# Patient Record
Sex: Female | Born: 1985 | Race: White | Hispanic: No | Marital: Married | Smoking: Former smoker
Health system: Southern US, Community
[De-identification: ages and names within clinical notes are randomized; demographics above are authoritative.]

## PROBLEM LIST (undated history)

## (undated) DIAGNOSIS — F419 Anxiety disorder, unspecified: Secondary | ICD-10-CM

## (undated) HISTORY — PX: APPENDECTOMY: SHX54

---
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
## 2009-02-12 ENCOUNTER — Emergency Department (HOSPITAL_COMMUNITY): Admission: EM | Admit: 2009-02-12 | Discharge: 2009-02-12 | Payer: Self-pay | Admitting: Emergency Medicine

## 2009-12-23 ENCOUNTER — Emergency Department (HOSPITAL_COMMUNITY): Admission: EM | Admit: 2009-12-23 | Discharge: 2009-12-23 | Payer: Self-pay | Admitting: Family Medicine

## 2011-04-21 ENCOUNTER — Emergency Department (HOSPITAL_COMMUNITY)
Admission: EM | Admit: 2011-04-21 | Discharge: 2011-04-21 | Disposition: A | Discharge status: Home Health | Payer: Self-pay | Source: Home / Self Care | Attending: Emergency Medicine | Admitting: Emergency Medicine

## 2011-04-21 ENCOUNTER — Encounter (HOSPITAL_COMMUNITY): Payer: Self-pay | Admitting: Emergency Medicine

## 2011-04-21 DIAGNOSIS — N3000 Acute cystitis without hematuria: Secondary | ICD-10-CM

## 2011-04-21 LAB — POCT URINALYSIS DIP (DEVICE)
Glucose, UA: NEGATIVE mg/dL
Nitrite: NEGATIVE
Specific Gravity, Urine: 1.01 (ref 1.005–1.030)
Urobilinogen, UA: 0.2 mg/dL (ref 0.0–1.0)
pH: 6.5 (ref 5.0–8.0)

## 2011-04-21 MED ORDER — PHENAZOPYRIDINE HCL 200 MG PO TABS: 200.0000 mg | ORAL_TABLET | Freq: Three times a day (TID) | ORAL | Status: AC | PRN

## 2011-04-21 MED ORDER — CEPHALEXIN 500 MG PO CAPS: 500.0000 mg | ORAL_CAPSULE | Freq: Three times a day (TID) | ORAL | Status: AC

## 2011-04-21 NOTE — ED Notes (Signed)
HERE WITH UTI SX THAT STARTED YESTERDAY.FREQ/URGE AND PAIN POST VOID WITH ACHES,CHILLS AND RADIATING PAIN TO LOWER BACK.NO HEMATURIA OR FEVERS BUT NOTICED CHILLS,BODY ACHES.PT TRIED IBUPROFEN FOR PAIN.LMP 04/01/11

## 2011-04-21 NOTE — ED Provider Notes (Signed)
Chief Complaint  Patient presents with  . Urinary Tract Infection    History of Present Illness:   Melissa Roberson is a 26 year old female who has had a two-day history of bladder spasms, dysuria, burning with urination, frequency, and urgency. She has felt somewhat achy all over and been nauseated. She's had chills and sweats but no definite fever. She's had some headache and suprapubic pain. She has had urinary tract infections before, but her last was 4 years ago. When she was a teenager she took antibiotics for 6 months for suppression. She denies any GYN complaints.  Review of Systems:  Other than noted above, the patient denies any of the following symptoms: General:  No fevers, chills, sweats, aches, or fatigue. GI:  No abdominal pain, back pain, nausea, vomiting, diarrhea, or constipation. GU:  No dysuria, frequency, urgency, hematuria, or incontinence. GYN:  No discharge, itching, vulvar pain or lesions, pelvic pain, or abnormal vaginal bleeding.  PMFSH:  Past medical history, family history, social history, meds, and allergies were reviewed.  Physical Exam:   Vital signs:  BP 110/63  Pulse 84  Temp(Src) 98.2 F (36.8 C) (Oral)  Resp 16  SpO2 100%  LMP 04/01/2011 Gen:  Alert, oriented, in no distress. Lungs:  Clear to auscultation, no wheezes, rales or rhonchi. Heart:  Regular rhythm, no gallop or murmer. Abdomen:  Flat and soft. There was slight suprapubic pain to palpation.  No guarding, or rebound.  No hepato-splenomegaly or mass.  Bowel sounds were normally active.  No hernia. Back:  No CVA tenderness.  Skin:  Clear, warm and dry.  Labs:   Results for orders placed during the hospital encounter of 04/21/11  POCT URINALYSIS DIP (DEVICE)      Component Value Range   Glucose, UA NEGATIVE  NEGATIVE (mg/dL)   Bilirubin Urine NEGATIVE  NEGATIVE    Ketones, ur NEGATIVE  NEGATIVE (mg/dL)   Specific Gravity, Urine 1.010  1.005 - 1.030    Hgb urine dipstick LARGE (*) NEGATIVE    pH  6.5  5.0 - 8.0    Protein, ur 100 (*) NEGATIVE (mg/dL)   Urobilinogen, UA 0.2  0.0 - 1.0 (mg/dL)   Nitrite NEGATIVE  NEGATIVE    Leukocytes, UA MODERATE (*) NEGATIVE   POCT PREGNANCY, URINE      Component Value Range   Preg Test, Ur NEGATIVE  NEGATIVE     A urine culture was not obtained at patient's request. She is concerned about incurring a large bill.  Assessment:  Diagnoses that have been ruled out:  None  Diagnoses that are still under consideration:  None  Final diagnoses:  Acute cystitis     Plan:   1.  The following meds were prescribed:   New Prescriptions   CEPHALEXIN (KEFLEX) 500 MG CAPSULE    Take 1 capsule (500 mg total) by mouth 3 (three) times daily.   PHENAZOPYRIDINE (PYRIDIUM) 200 MG TABLET    Take 1 tablet (200 mg total) by mouth 3 (three) times daily as needed for pain.   2.  The patient was instructed in symptomatic care and handouts were given. 3.  The patient was told to return if becoming worse in any way, if no better in 3 or 4 days, and given some red flag symptoms that would indicate earlier return. 4.  The patient was told to avoid intercourse for 10 days, get extra fluids, and return for a follow up with her primary care doctor at the completion of treatment for  a repeat UA and culture.     Roque Lias, MD 04/21/11 [phone]

## 2011-04-21 NOTE — Discharge Instructions (Signed)

## 2011-06-25 ENCOUNTER — Emergency Department (HOSPITAL_COMMUNITY)
Admission: EM | Admit: 2011-06-25 | Discharge: 2011-06-25 | Disposition: A | Discharge status: Home / Self Care | Payer: Self-pay | Source: Home / Self Care | Attending: Family Medicine | Admitting: Family Medicine

## 2011-06-25 ENCOUNTER — Encounter (HOSPITAL_COMMUNITY): Payer: Self-pay | Admitting: *Deleted

## 2011-06-25 DIAGNOSIS — N390 Urinary tract infection, site not specified: Secondary | ICD-10-CM

## 2011-06-25 LAB — POCT URINALYSIS DIP (DEVICE)
Bilirubin Urine: NEGATIVE
Glucose, UA: NEGATIVE mg/dL
Nitrite: POSITIVE — AB
Specific Gravity, Urine: 1.01 (ref 1.005–1.030)
pH: 5.5 (ref 5.0–8.0)

## 2011-06-25 MED ORDER — CEPHALEXIN 500 MG PO CAPS: 500.0000 mg | ORAL_CAPSULE | Freq: Four times a day (QID) | ORAL | Status: AC

## 2011-06-25 NOTE — ED Provider Notes (Signed)
History     CSN: [id number]  Arrival date & time 06/25/11  1409   First MD Initiated Contact with Patient 06/25/11 1437      Chief Complaint  Patient presents with  . Urinary Frequency    (Consider location/radiation/quality/duration/timing/severity/associated sxs/prior treatment) Patient is a 26 y.o. female presenting with frequency. The history is provided by the patient.  Urinary Frequency This is a new problem. The current episode started 2 days ago (first time in sev months, h/o chronic suppression.). The problem occurs constantly. The problem has been gradually worsening. Pertinent negatives include no abdominal pain.    History reviewed. No pertinent past medical history.  Past Surgical History  Procedure Date  . Cesarean section     History reviewed. No pertinent family history.  History  Substance Use Topics  . Smoking status: Current Everyday Smoker  . Smokeless tobacco: Not on file  . Alcohol Use: Yes    OB History    Grav Para Term Preterm Abortions TAB SAB Ect Mult Living                  Review of Systems  Constitutional: Negative.   Gastrointestinal: Negative.  Negative for abdominal pain.  Genitourinary: Positive for dysuria, urgency, frequency and hematuria. Negative for vaginal bleeding, vaginal discharge and vaginal pain.    Allergies  Review of patient's allergies indicates no known allergies.  Home Medications   Current Outpatient Rx  Name Route Sig Dispense Refill  . CEPHALEXIN 500 MG PO CAPS Oral Take 1 capsule (500 mg total) by mouth 4 (four) times daily. Take all of medicine and drink lots of fluids 20 capsule 0  . MULTIVITAMINS PO CAPS Oral Take 1 capsule by mouth daily.      BP 130/77  Pulse 85  Temp(Src) 98.5 F (36.9 C) (Oral)  Resp 17  SpO2 97%  Physical Exam  Nursing note and vitals reviewed. Constitutional: She is oriented to person, place, and time. She appears well-developed and well-nourished.  Abdominal: Soft.  Bowel sounds are normal. There is no tenderness. There is no CVA tenderness.  Neurological: She is alert and oriented to person, place, and time.  Skin: Skin is warm and dry.    ED Course  Procedures (including critical care time)  Labs Reviewed  POCT URINALYSIS DIP (DEVICE) - Abnormal; Notable for the following:    Hgb urine dipstick LARGE (*)    Protein, ur 30 (*)    Nitrite POSITIVE (*)    Leukocytes, UA LARGE (*) Biochemical Testing Only. Please order routine urinalysis from main lab if confirmatory testing is needed.   All other components within normal limits  POCT PREGNANCY, URINE   No results found.   1. UTI (lower urinary tract infection)       MDM  U/a abnl.        Linna Hoff, MD 06/25/11 [phone]

## 2011-06-25 NOTE — ED Notes (Signed)
Co low abd pain, with frequent urination, burning with urination x 2 days, blood in urine this am.

## 2012-04-19 ENCOUNTER — Encounter (HOSPITAL_COMMUNITY): Payer: Self-pay | Admitting: *Deleted

## 2012-04-19 ENCOUNTER — Emergency Department (HOSPITAL_COMMUNITY)
Admission: EM | Admit: 2012-04-19 | Discharge: 2012-04-19 | Disposition: A | Discharge status: Home / Self Care | Payer: Self-pay | Source: Home / Self Care

## 2012-04-19 MED ORDER — AMOXICILLIN 500 MG PO CAPS: 500.0000 mg | ORAL_CAPSULE | Freq: Two times a day (BID) | ORAL | Status: DC

## 2012-04-19 NOTE — ED Provider Notes (Signed)
History     CSN: [id number]  Arrival date & time 04/19/12  1026   None     Chief Complaint  Patient presents with  . Generalized Body Aches    (Consider location/radiation/quality/duration/timing/severity/associated sxs/prior treatment) HPI Comments: Pt sick with cold sx for a week, feels is worsening instead of getting better.  Began taking rx for zithromax she got from sister 2 days ago.  Doesn't feel is helping. Drainage from coughing and from nose same.   Patient is a 27 y.o. female presenting with URI. The history is provided by the patient.  URI Presenting symptoms: congestion, cough, facial pain, fatigue, fever, rhinorrhea and sore throat   Presenting symptoms: no ear pain   Severity:  Severe Onset quality:  Gradual Duration:  7 days Timing:  Constant Progression:  Worsening Chronicity:  New Relieved by:  Nothing Ineffective treatments:  OTC medications and rest Associated symptoms: headaches, myalgias and sinus pain     History reviewed. No pertinent past medical history.  Past Surgical History  Procedure Laterality Date  . Cesarean section      History reviewed. No pertinent family history.  History  Substance Use Topics  . Smoking status: Light Tobacco Smoker    Types: Cigarettes  . Smokeless tobacco: Never Used     Comment: Only smokes when drinks ETOH  . Alcohol Use: Yes     Comment: Socially    OB History   Grav Para Term Preterm Abortions TAB SAB Ect Mult Living                  Review of Systems  Constitutional: Positive for fever, chills and fatigue.  HENT: Positive for congestion, sore throat, rhinorrhea, postnasal drip and sinus pressure. Negative for ear pain.   Respiratory: Positive for cough.   Musculoskeletal: Positive for myalgias.  Neurological: Positive for headaches.    Allergies  Review of patient's allergies indicates not on file.  Home Medications   Current Outpatient Rx  Name  Route  Sig  Dispense  Refill  .  acetaminophen (TYLENOL) 500 MG tablet   Oral   Take 500 mg by mouth every 6 (six) hours as needed for pain.         Marland Kitchen azithromycin (ZITHROMAX) 1 G powder   Oral   Take 1 packet by mouth once.         Marland Kitchen ibuprofen (ADVIL,MOTRIN) 200 MG tablet   Oral   Take 800 mg by mouth every 6 (six) hours as needed for pain.         Marland Kitchen amoxicillin (AMOXIL) 500 MG capsule   Oral   Take 1 capsule (500 mg total) by mouth 2 (two) times daily.   20 capsule   0   . Multiple Vitamin (MULTIVITAMIN) capsule   Oral   Take 1 capsule by mouth daily.           BP 128/81  Pulse 100  Temp(Src) 99.4 F (37.4 C) (Oral)  SpO2 100%  LMP 03/25/2012  Physical Exam  Constitutional: She appears well-developed and well-nourished. She appears ill. No distress.  HENT:  Right Ear: Tympanic membrane, external ear and ear canal normal.  Left Ear: Tympanic membrane, external ear and ear canal normal.  Nose: Mucosal edema and rhinorrhea present. Right sinus exhibits maxillary sinus tenderness. Right sinus exhibits no frontal sinus tenderness. Left sinus exhibits maxillary sinus tenderness. Left sinus exhibits no frontal sinus tenderness.  Mouth/Throat: Oropharynx is clear and moist.  Cardiovascular: Normal rate  and regular rhythm.   Pulmonary/Chest: Effort normal and breath sounds normal. She exhibits no tenderness.  Lymphadenopathy:       Head (right side): No submental, no submandibular and no tonsillar adenopathy present.       Head (left side): No submental, no submandibular and no tonsillar adenopathy present.    ED Course  Procedures (including critical care time)  Labs Reviewed - No data to display No results found.   1. Sinusitis       MDM  This is likely viral infection but could be sinusitis.  Pt already taking zpack.  Discussed need for sudafed and saline nasal spray. Explained most sinus infections clear themselves.  Rx amox but pt not to take unless still sick on day 10-11.  Pt agrees.          Cathlyn Parsons, NP 04/19/12 [phone]

## 2012-04-19 NOTE — ED Provider Notes (Signed)
Medical screening examination/treatment/procedure(s) were performed by resident physician or non-physician practitioner and as supervising physician I was immediately available for consultation/collaboration.   Roland Prine DOUGLAS MD.   Javonni Macke D Adrienne Trombetta, MD 04/19/12 1458 

## 2012-04-19 NOTE — ED Notes (Signed)
Sore throat - productive cough green phlegm,with "chunks of blood" ( "not that bad now") - diarrhea - severe body aches.onset 8 days ago.

## 2013-06-29 ENCOUNTER — Emergency Department (HOSPITAL_COMMUNITY): Payer: BC Managed Care – PPO

## 2013-06-29 ENCOUNTER — Encounter (HOSPITAL_COMMUNITY): Payer: Self-pay | Admitting: Emergency Medicine

## 2013-06-29 ENCOUNTER — Observation Stay (HOSPITAL_COMMUNITY)
Admission: EM | Admit: 2013-06-29 | Discharge: 2013-06-30 | Disposition: A | Discharge status: Home / Self Care | Payer: BC Managed Care – PPO | Attending: General Surgery | Admitting: General Surgery

## 2013-06-29 ENCOUNTER — Encounter (HOSPITAL_COMMUNITY): Payer: BC Managed Care – PPO | Admitting: Anesthesiology

## 2013-06-29 ENCOUNTER — Emergency Department (HOSPITAL_COMMUNITY): Payer: BC Managed Care – PPO | Admitting: Anesthesiology

## 2013-06-29 ENCOUNTER — Encounter (HOSPITAL_COMMUNITY)
Admission: EM | Disposition: A | Discharge status: Home / Self Care | Payer: Self-pay | Source: Home / Self Care | Attending: Emergency Medicine

## 2013-06-29 DIAGNOSIS — D3A020 Benign carcinoid tumor of the appendix: Principal | ICD-10-CM | POA: Insufficient documentation

## 2013-06-29 DIAGNOSIS — K3580 Unspecified acute appendicitis: Secondary | ICD-10-CM

## 2013-06-29 DIAGNOSIS — F17200 Nicotine dependence, unspecified, uncomplicated: Secondary | ICD-10-CM | POA: Insufficient documentation

## 2013-06-29 DIAGNOSIS — K37 Unspecified appendicitis: Secondary | ICD-10-CM | POA: Diagnosis present

## 2013-06-29 DIAGNOSIS — R109 Unspecified abdominal pain: Secondary | ICD-10-CM

## 2013-06-29 HISTORY — PX: LAPAROSCOPIC APPENDECTOMY: SHX408

## 2013-06-29 HISTORY — DX: Anxiety disorder, unspecified: F41.9

## 2013-06-29 LAB — CBC WITH DIFFERENTIAL/PLATELET
BASOS ABS: 0 10*3/uL (ref 0.0–0.1)
Basophils Relative: 0 % (ref 0–1)
Eosinophils Absolute: 0 10*3/uL (ref 0.0–0.7)
Eosinophils Relative: 0 % (ref 0–5)
HEMATOCRIT: 39.7 % (ref 36.0–46.0)
HEMOGLOBIN: 13.6 g/dL (ref 12.0–15.0)
LYMPHS PCT: 11 % — AB (ref 12–46)
Lymphs Abs: 1.2 10*3/uL (ref 0.7–4.0)
MCH: 31 pg (ref 26.0–34.0)
MCHC: 34.3 g/dL (ref 30.0–36.0)
MCV: 90.4 fL (ref 78.0–100.0)
MONO ABS: 0.4 10*3/uL (ref 0.1–1.0)
MONOS PCT: 4 % (ref 3–12)
NEUTROS ABS: 8.9 10*3/uL — AB (ref 1.7–7.7)
NEUTROS PCT: 84 % — AB (ref 43–77)
Platelets: 217 10*3/uL (ref 150–400)
RBC: 4.39 MIL/uL (ref 3.87–5.11)
RDW: 12.1 % (ref 11.5–15.5)
WBC: 10.5 10*3/uL (ref 4.0–10.5)

## 2013-06-29 LAB — I-STAT CHEM 8, ED
BUN: 10 mg/dL (ref 6–23)
CHLORIDE: 109 meq/L (ref 96–112)
CREATININE: 0.7 mg/dL (ref 0.50–1.10)
Calcium, Ion: 0.93 mmol/L — ABNORMAL LOW (ref 1.12–1.23)
GLUCOSE: 99 mg/dL (ref 70–99)
HEMATOCRIT: 41 % (ref 36.0–46.0)
Hemoglobin: 13.9 g/dL (ref 12.0–15.0)
POTASSIUM: 6 meq/L — AB (ref 3.7–5.3)
SODIUM: 137 meq/L (ref 137–147)
TCO2: 20 mmol/L (ref 0–100)

## 2013-06-29 LAB — COMPREHENSIVE METABOLIC PANEL
ALT: 14 U/L (ref 0–35)
AST: 15 U/L (ref 0–37)
Albumin: 4.2 g/dL (ref 3.5–5.2)
Alkaline Phosphatase: 51 U/L (ref 39–117)
BUN: 9 mg/dL (ref 6–23)
CALCIUM: 9 mg/dL (ref 8.4–10.5)
CO2: 24 mEq/L (ref 19–32)
CREATININE: 0.62 mg/dL (ref 0.50–1.10)
Chloride: 105 mEq/L (ref 96–112)
GFR calc non Af Amer: >90 mL/min (ref >90)
GLUCOSE: 100 mg/dL — AB (ref 70–99)
Potassium: 4.4 mEq/L (ref 3.7–5.3)
Sodium: 142 mEq/L (ref 137–147)
TOTAL PROTEIN: 7.3 g/dL (ref 6.0–8.3)
Total Bilirubin: 0.5 mg/dL (ref 0.3–1.2)

## 2013-06-29 LAB — LIPASE, BLOOD: Lipase: 26 U/L (ref 11–59)

## 2013-06-29 LAB — PREGNANCY, URINE: Preg Test, Ur: NEGATIVE

## 2013-06-29 LAB — URINALYSIS, ROUTINE W REFLEX MICROSCOPIC
BILIRUBIN URINE: NEGATIVE
Glucose, UA: NEGATIVE mg/dL
Hgb urine dipstick: NEGATIVE
Leukocytes, UA: NEGATIVE
NITRITE: NEGATIVE
PROTEIN: NEGATIVE mg/dL
SPECIFIC GRAVITY, URINE: 1.017 (ref 1.005–1.030)
UROBILINOGEN UA: 0.2 mg/dL (ref 0.0–1.0)
pH: 7.5 (ref 5.0–8.0)

## 2013-06-29 IMAGING — CT CT ABD-PELV W/ CM
1 of 2 series · 15 of 32 positions shown, 19 images · IV contrast (OMNIPAQUE 300)
Comparison: None.

CLINICAL DATA: Abdominal pain with nausea and vomiting.

EXAM:
CT ABDOMEN AND PELVIS WITH CONTRAST
TECHNIQUE: Multidetector CT imaging of the abdomen and pelvis was performed
using the standard protocol following bolus administration of
intravenous contrast.
CONTRAST:  50mL OMNIPAQUE IOHEXOL 300 MG/ML SOLN, 100mL OMNIPAQUE
IOHEXOL 300 MG/ML SOLN

[Series 2: abd/pel with · axial · 0.74mm/px · z∈[-494,-99]mm · 15 of 87 slices shown, 19 images]
[im 4/87  soft-tissue]
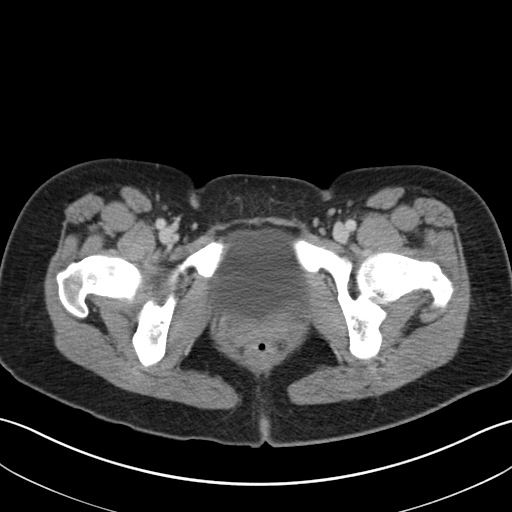
[im 4/87  bone]
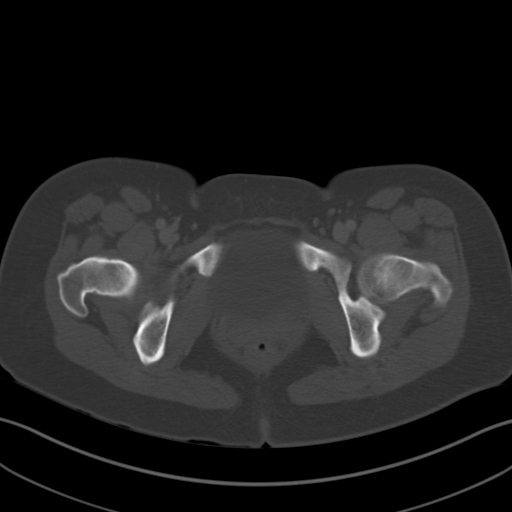
[im 11/87  soft-tissue]
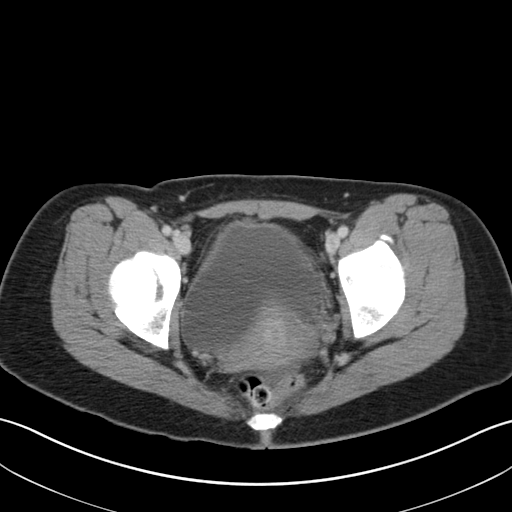
[im 18/87  soft-tissue]
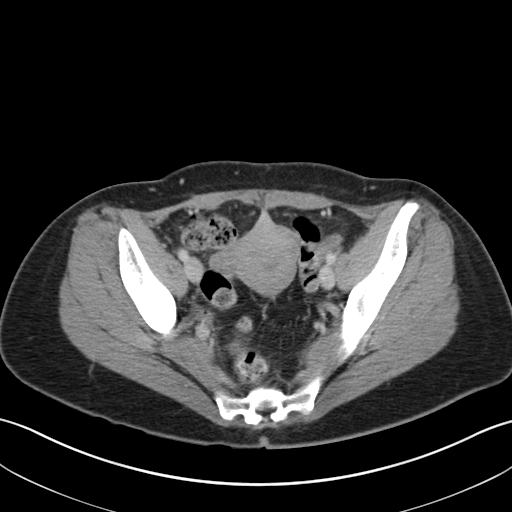
[im 26/87  soft-tissue]
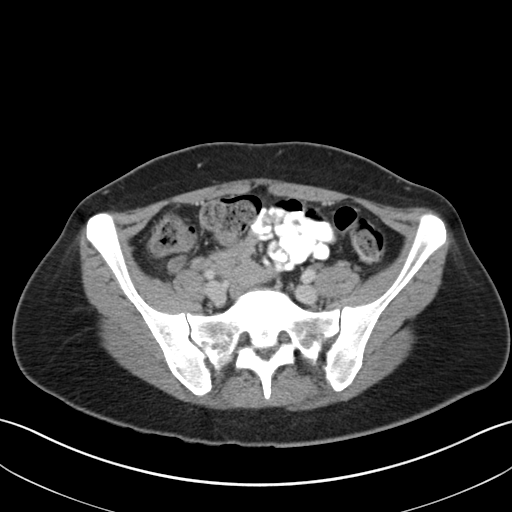
[im 29/87  soft-tissue]
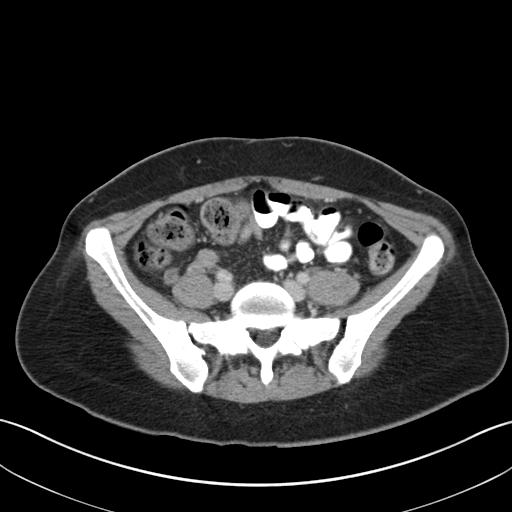
[im 36/87  soft-tissue]
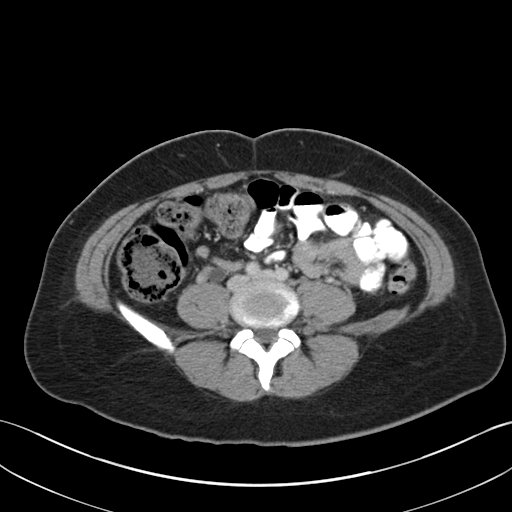
[im 44/87  soft-tissue]
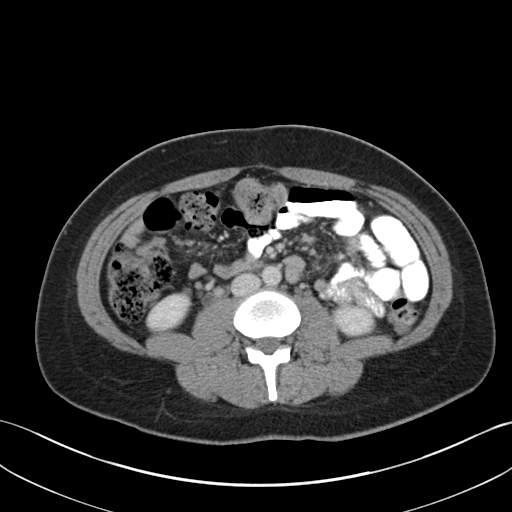
[im 51/87  soft-tissue]
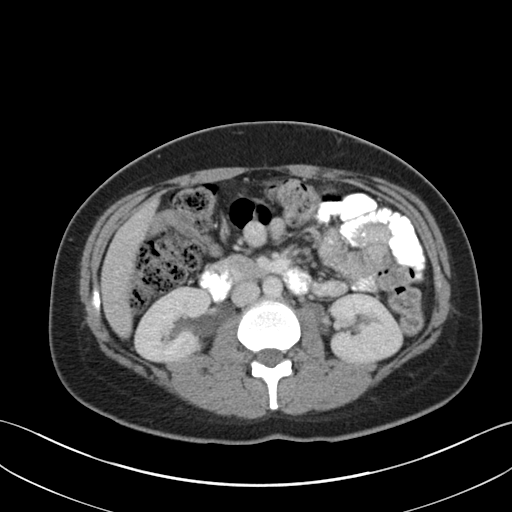
[im 58/87  soft-tissue]
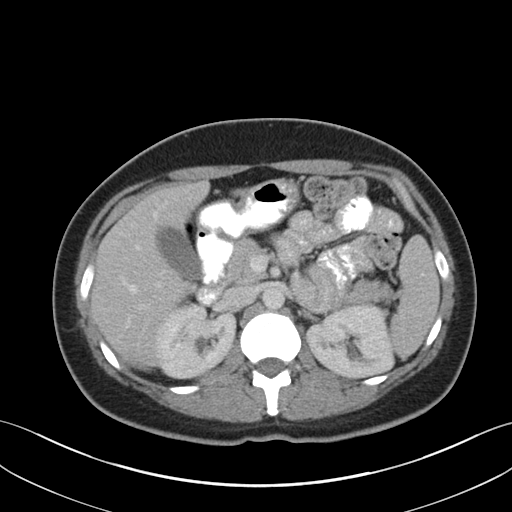
[im 58/87  bone]
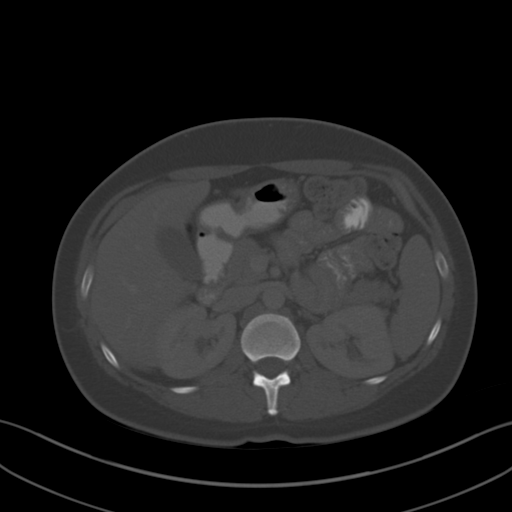
[im 61/87  soft-tissue]
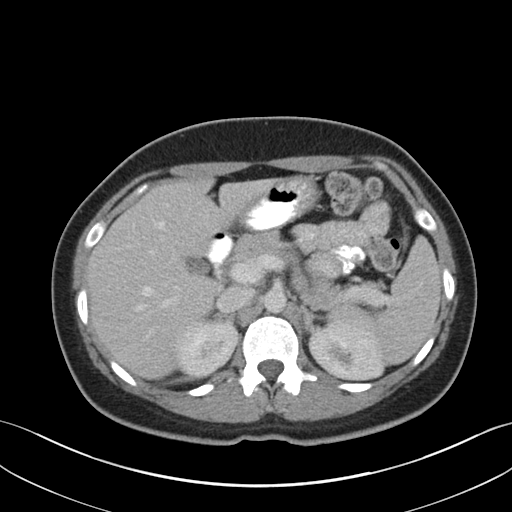
[im 69/87  soft-tissue]
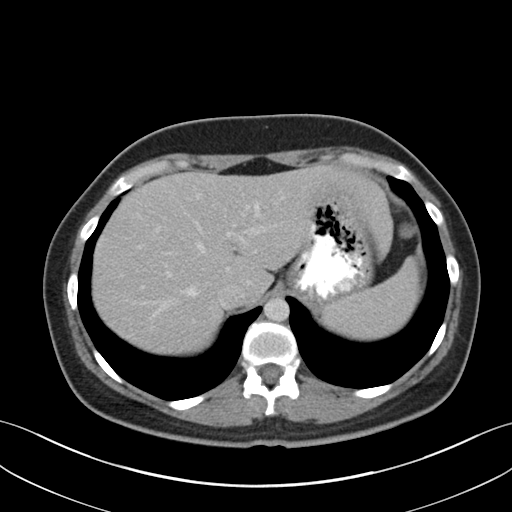
[im 72/87  lung]
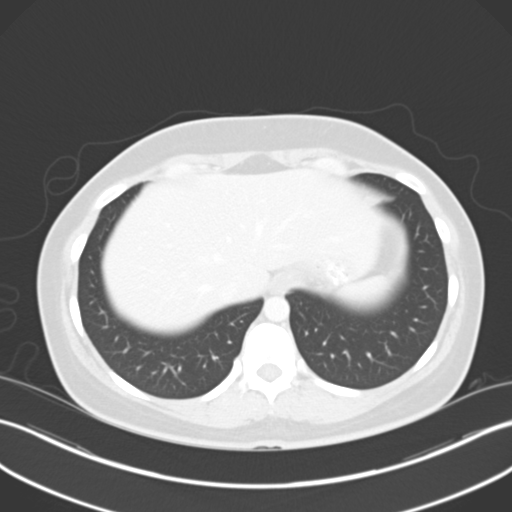
[im 76/87  soft-tissue]
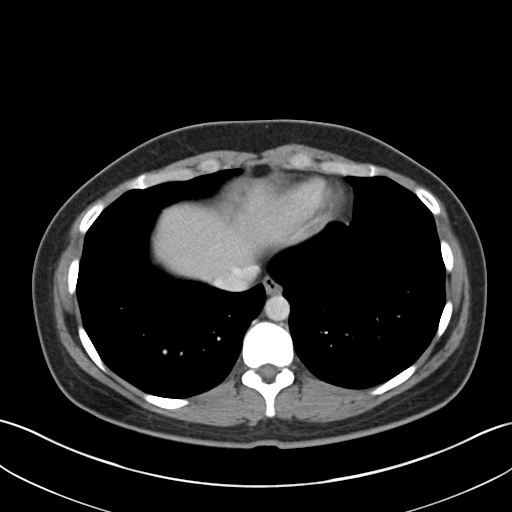
[im 76/87  lung]
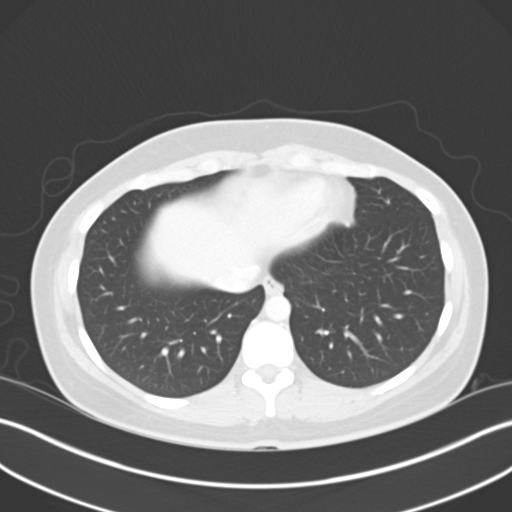
[im 79/87  lung]
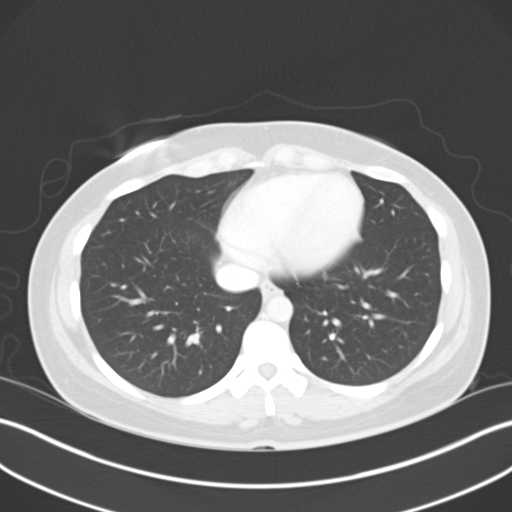
[im 83/87  soft-tissue]
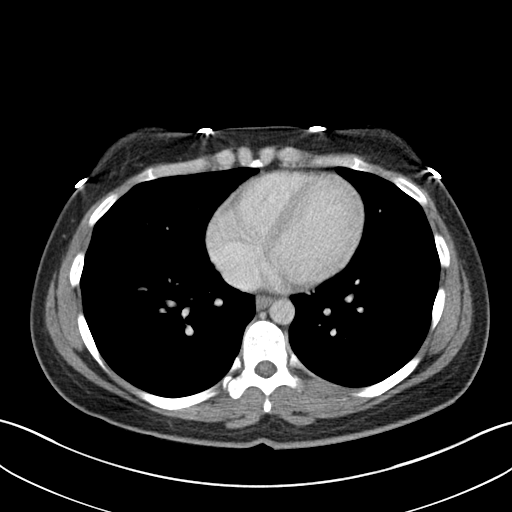
[im 83/87  lung]
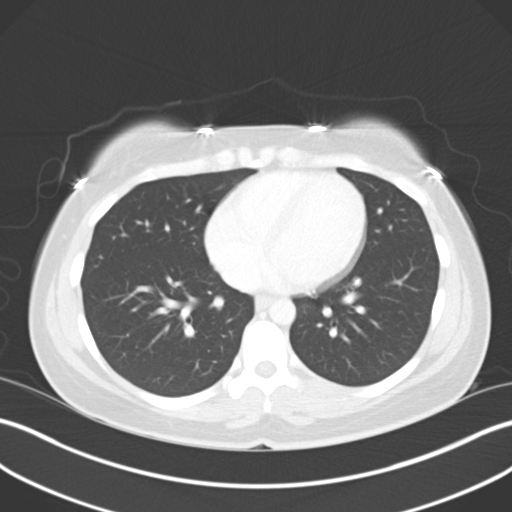

[15 of 32 positions shown; findings below may reference images not displayed]

FINDINGS: Included view of the lung bases are clear. Visualized heart and
pericardium are unremarkable.

The liver, spleen, gallbladder, pancreas and adrenal glands are
unremarkable.

The stomach, small and large bowel are normal in course and caliber
without inflammatory changes. Enteric contrast has not yet reached
the distal small bowel. The appendix is enlarged, 10 mm in
transaxial dimension. Without superimposed periappendiceal
inflammatory changes. Moderate amount of retained large bowel stool.
No intraperitoneal free fluid nor free air.

Kidneys are orthotopic, demonstrating symmetric enhancement. Mild to
moderate right hydroureteronephrosis. No nephrolithiasis. No solid
renal masses. The unopacified ureters are normal in course and
caliber. Urinary bladder is partially distended and unremarkable.

Aortoiliac vessels are normal in course and caliber. No
lymphadenopathy by CT size criteria. Anterior lower uterine segment
Caesarean section scar. The soft tissues and included osseous
structures are nonsuspicious.
IMPRESSION: Enlarged appendix without superimposed inflammatory changes,
equivocal for early acute appendicitis.

Mild to moderate right hydro nephrosis without urolithiasis.

  By: KEI

## 2013-06-29 SURGERY — APPENDECTOMY, LAPAROSCOPIC
Anesthesia: General | Site: Abdomen

## 2013-06-29 MED ORDER — PROPOFOL 10 MG/ML IV BOLUS
INTRAVENOUS | Status: AC
  Filled 2013-06-29: qty 20

## 2013-06-29 MED ORDER — KETAMINE HCL 10 MG/ML IJ SOLN
INTRAMUSCULAR | Status: AC
  Filled 2013-06-29: qty 1

## 2013-06-29 MED ORDER — FENTANYL CITRATE 0.05 MG/ML IJ SOLN
INTRAMUSCULAR | Status: AC
  Filled 2013-06-29: qty 5

## 2013-06-29 MED ORDER — MORPHINE SULFATE 2 MG/ML IJ SOLN
2.0000 mg | INTRAMUSCULAR | Status: DC | PRN
  Administered 2013-06-29 – 2013-06-30 (×6): 2 mg via INTRAVENOUS
  Filled 2013-06-29 (×6): qty 1

## 2013-06-29 MED ORDER — DEXAMETHASONE SODIUM PHOSPHATE 10 MG/ML IJ SOLN
INTRAMUSCULAR | Status: DC | PRN
  Administered 2013-06-29: 10 mg via INTRAVENOUS

## 2013-06-29 MED ORDER — CISATRACURIUM BESYLATE (PF) 10 MG/5ML IV SOLN
INTRAVENOUS | Status: DC | PRN
  Administered 2013-06-29: 7 mg via INTRAVENOUS

## 2013-06-29 MED ORDER — BUPIVACAINE-EPINEPHRINE (PF) 0.25% -1:200000 IJ SOLN
INTRAMUSCULAR | Status: AC
  Filled 2013-06-29: qty 30

## 2013-06-29 MED ORDER — MENTHOL 3 MG MT LOZG
1.0000 | LOZENGE | OROMUCOSAL | Status: DC | PRN
  Administered 2013-06-29: 3 mg via ORAL
  Filled 2013-06-29: qty 9

## 2013-06-29 MED ORDER — BUPIVACAINE-EPINEPHRINE 0.25% -1:200000 IJ SOLN
INTRAMUSCULAR | Status: DC | PRN
  Administered 2013-06-29: 20 mL

## 2013-06-29 MED ORDER — IOHEXOL 300 MG/ML  SOLN
100.0000 mL | Freq: Once | INTRAMUSCULAR | Status: AC | PRN
  Administered 2013-06-29: 100 mL via INTRAVENOUS

## 2013-06-29 MED ORDER — SODIUM CHLORIDE 0.9 % IJ SOLN
INTRAMUSCULAR | Status: AC
  Filled 2013-06-29: qty 10

## 2013-06-29 MED ORDER — LACTATED RINGERS IR SOLN
Status: DC | PRN
  Administered 2013-06-29: 1000 mL

## 2013-06-29 MED ORDER — IOHEXOL 300 MG/ML  SOLN
50.0000 mL | Freq: Once | INTRAMUSCULAR | Status: AC | PRN
  Administered 2013-06-29: 50 mL via ORAL

## 2013-06-29 MED ORDER — LACTATED RINGERS IV SOLN
INTRAVENOUS | Status: DC | PRN
  Administered 2013-06-29: 10:00:00 via INTRAVENOUS

## 2013-06-29 MED ORDER — PROPOFOL 10 MG/ML IV BOLUS
INTRAVENOUS | Status: DC | PRN
  Administered 2013-06-29: 180 mg via INTRAVENOUS

## 2013-06-29 MED ORDER — ONDANSETRON HCL 4 MG/2ML IJ SOLN
4.0000 mg | Freq: Four times a day (QID) | INTRAMUSCULAR | Status: DC | PRN
  Administered 2013-06-29: 4 mg via INTRAVENOUS
  Filled 2013-06-29: qty 2

## 2013-06-29 MED ORDER — NEOSTIGMINE METHYLSULFATE 10 MG/10ML IV SOLN
INTRAVENOUS | Status: DC | PRN
  Administered 2013-06-29: 3 mg via INTRAVENOUS

## 2013-06-29 MED ORDER — SODIUM CHLORIDE 0.9 % IV SOLN
Freq: Once | INTRAVENOUS | Status: AC
  Administered 2013-06-29: 150 mL/h via INTRAVENOUS

## 2013-06-29 MED ORDER — ONDANSETRON HCL 4 MG/2ML IJ SOLN
4.0000 mg | Freq: Once | INTRAMUSCULAR | Status: AC
  Administered 2013-06-29: 4 mg via INTRAVENOUS
  Filled 2013-06-29: qty 2

## 2013-06-29 MED ORDER — MORPHINE SULFATE 4 MG/ML IJ SOLN
4.0000 mg | Freq: Once | INTRAMUSCULAR | Status: AC
  Administered 2013-06-29: 4 mg via INTRAVENOUS
  Filled 2013-06-29: qty 1

## 2013-06-29 MED ORDER — KETOROLAC TROMETHAMINE 30 MG/ML IJ SOLN
15.0000 mg | Freq: Once | INTRAMUSCULAR | Status: DC | PRN
Start: 2013-06-29 — End: 2013-06-29

## 2013-06-29 MED ORDER — GLYCOPYRROLATE 0.2 MG/ML IJ SOLN
INTRAMUSCULAR | Status: AC
  Filled 2013-06-29: qty 2

## 2013-06-29 MED ORDER — KCL IN DEXTROSE-NACL 20-5-0.9 MEQ/L-%-% IV SOLN
INTRAVENOUS | Status: DC
  Administered 2013-06-29: 12:00:00 via INTRAVENOUS
  Administered 2013-06-29: 100 mL/h via INTRAVENOUS
  Filled 2013-06-29 (×3): qty 1000

## 2013-06-29 MED ORDER — SUCCINYLCHOLINE CHLORIDE 20 MG/ML IJ SOLN
INTRAMUSCULAR | Status: DC | PRN
  Administered 2013-06-29: 100 mg via INTRAVENOUS

## 2013-06-29 MED ORDER — GLYCOPYRROLATE 0.2 MG/ML IJ SOLN
INTRAMUSCULAR | Status: DC | PRN
  Administered 2013-06-29: 0.4 mg via INTRAVENOUS

## 2013-06-29 MED ORDER — 0.9 % SODIUM CHLORIDE (POUR BTL) OPTIME
TOPICAL | Status: DC | PRN
  Administered 2013-06-29: 1000 mL

## 2013-06-29 MED ORDER — MIDAZOLAM HCL 5 MG/5ML IJ SOLN
INTRAMUSCULAR | Status: DC | PRN
  Administered 2013-06-29: 1 mg via INTRAVENOUS

## 2013-06-29 MED ORDER — SODIUM CHLORIDE 0.9 % IV SOLN
1.0000 g | INTRAVENOUS | Status: DC
  Administered 2013-06-29: 1 g via INTRAVENOUS
  Filled 2013-06-29 (×2): qty 1

## 2013-06-29 MED ORDER — HYDROMORPHONE HCL PF 1 MG/ML IJ SOLN
INTRAMUSCULAR | Status: AC
  Filled 2013-06-29: qty 1

## 2013-06-29 MED ORDER — KCL IN DEXTROSE-NACL 20-5-0.9 MEQ/L-%-% IV SOLN
INTRAVENOUS | Status: AC
  Administered 2013-06-29: 1000 mL
  Filled 2013-06-29: qty 1000

## 2013-06-29 MED ORDER — KETOROLAC TROMETHAMINE 30 MG/ML IJ SOLN
INTRAMUSCULAR | Status: AC
Start: 2013-06-29 — End: 2013-06-29
  Administered 2013-06-29: 30 mg
  Filled 2013-06-29: qty 1

## 2013-06-29 MED ORDER — FENTANYL CITRATE 0.05 MG/ML IJ SOLN
INTRAMUSCULAR | Status: DC | PRN
  Administered 2013-06-29 (×2): 50 ug via INTRAVENOUS
  Administered 2013-06-29 (×2): 25 ug via INTRAVENOUS
  Administered 2013-06-29: 100 ug via INTRAVENOUS

## 2013-06-29 MED ORDER — HYDROMORPHONE HCL PF 1 MG/ML IJ SOLN: 0.2500 mg | INTRAMUSCULAR | Status: DC | PRN

## 2013-06-29 MED ORDER — HYDROMORPHONE HCL PF 1 MG/ML IJ SOLN
0.5000 mg | Freq: Once | INTRAMUSCULAR | Status: AC
  Administered 2013-06-29: 0.5 mg via INTRAVENOUS
  Filled 2013-06-29: qty 1

## 2013-06-29 MED ORDER — PANTOPRAZOLE SODIUM 40 MG IV SOLR
40.0000 mg | Freq: Every day | INTRAVENOUS | Status: DC
  Administered 2013-06-29: 40 mg via INTRAVENOUS
  Filled 2013-06-29 (×2): qty 40

## 2013-06-29 MED ORDER — ONDANSETRON HCL 4 MG/2ML IJ SOLN
INTRAMUSCULAR | Status: AC
  Filled 2013-06-29: qty 2

## 2013-06-29 MED ORDER — SODIUM CHLORIDE 0.9 % IV BOLUS (SEPSIS)
1000.0000 mL | Freq: Once | INTRAVENOUS | Status: AC
  Administered 2013-06-29: 1000 mL via INTRAVENOUS

## 2013-06-29 MED ORDER — MIDAZOLAM HCL 2 MG/2ML IJ SOLN
INTRAMUSCULAR | Status: AC
  Filled 2013-06-29: qty 2

## 2013-06-29 MED ORDER — PROMETHAZINE HCL 25 MG/ML IJ SOLN: 6.2500 mg | INTRAMUSCULAR | Status: DC | PRN

## 2013-06-29 MED ORDER — KETAMINE HCL 10 MG/ML IJ SOLN
INTRAMUSCULAR | Status: DC | PRN
  Administered 2013-06-29: 25 mg via INTRAVENOUS

## 2013-06-29 MED ORDER — CISATRACURIUM BESYLATE 20 MG/10ML IV SOLN
INTRAVENOUS | Status: AC
  Filled 2013-06-29: qty 10

## 2013-06-29 MED ORDER — PROMETHAZINE HCL 25 MG/ML IJ SOLN
INTRAMUSCULAR | Status: AC
  Filled 2013-06-29: qty 1

## 2013-06-29 MED ORDER — ONDANSETRON HCL 4 MG/2ML IJ SOLN
INTRAMUSCULAR | Status: DC | PRN
  Administered 2013-06-29: 4 mg via INTRAVENOUS

## 2013-06-29 MED ORDER — EPHEDRINE SULFATE 50 MG/ML IJ SOLN
INTRAMUSCULAR | Status: AC
  Filled 2013-06-29: qty 1

## 2013-06-29 SURGICAL SUPPLY — 34 items
APPLIER CLIP ROT 10 11.4 M/L (STAPLE) ×3
CANISTER SUCTION 2500CC (MISCELLANEOUS) ×3 IMPLANT
CLIP APPLIE ROT 10 11.4 M/L (STAPLE) ×1 IMPLANT
CUTTER FLEX LINEAR 45M (STAPLE) ×3 IMPLANT
DECANTER SPIKE VIAL GLASS SM (MISCELLANEOUS) ×3 IMPLANT
DERMABOND ADVANCED (GAUZE/BANDAGES/DRESSINGS) ×2
DERMABOND ADVANCED .7 DNX12 (GAUZE/BANDAGES/DRESSINGS) ×1 IMPLANT
DRAPE LAPAROSCOPIC ABDOMINAL (DRAPES) ×3 IMPLANT
DRAPE UTILITY XL STRL (DRAPES) ×3 IMPLANT
ELECT REM PT RETURN 9FT ADLT (ELECTROSURGICAL) ×3
ELECTRODE REM PT RTRN 9FT ADLT (ELECTROSURGICAL) ×1 IMPLANT
ENDOLOOP SUT PDS II  0 18 (SUTURE)
ENDOLOOP SUT PDS II 0 18 (SUTURE) IMPLANT
GLOVE BIO SURGEON STRL SZ7.5 (GLOVE) ×3 IMPLANT
GOWN STRL REUS W/ TWL XL LVL3 (GOWN DISPOSABLE) ×1 IMPLANT
GOWN STRL REUS W/TWL XL LVL3 (GOWN DISPOSABLE) ×8 IMPLANT
IV LACTATED RINGERS 1000ML (IV SOLUTION) ×3 IMPLANT
KIT BASIN OR (CUSTOM PROCEDURE TRAY) ×3 IMPLANT
NS IRRIG 1000ML POUR BTL (IV SOLUTION) ×3 IMPLANT
PENCIL BUTTON HOLSTER BLD 10FT (ELECTRODE) ×3 IMPLANT
POUCH SPECIMEN RETRIEVAL 10MM (ENDOMECHANICALS) ×3 IMPLANT
RELOAD 45 VASCULAR/THIN (ENDOMECHANICALS) IMPLANT
RELOAD STAPLE TA45 3.5 REG BLU (ENDOMECHANICALS) ×6 IMPLANT
SET IRRIG TUBING LAPAROSCOPIC (IRRIGATION / IRRIGATOR) ×3 IMPLANT
SHEARS HARMONIC ACE PLUS 36CM (ENDOMECHANICALS) ×3 IMPLANT
SOLUTION ANTI FOG 6CC (MISCELLANEOUS) ×3 IMPLANT
SUT MNCRL AB 4-0 PS2 18 (SUTURE) ×3 IMPLANT
TOWEL OR 17X26 10 PK STRL BLUE (TOWEL DISPOSABLE) ×3 IMPLANT
TRAY FOLEY CATH 14FRSI W/METER (CATHETERS) ×3 IMPLANT
TRAY LAP CHOLE (CUSTOM PROCEDURE TRAY) ×3 IMPLANT
TROCAR BLADELESS OPT 5 75 (ENDOMECHANICALS) ×3 IMPLANT
TROCAR SLEEVE XCEL 5X75 (ENDOMECHANICALS) ×3 IMPLANT
TROCAR XCEL BLUNT TIP 100MML (ENDOMECHANICALS) ×3 IMPLANT
TUBING INSUFFLATION 10FT LAP (TUBING) ×3 IMPLANT

## 2013-06-29 NOTE — ED Notes (Signed)
Surgery at bedside.

## 2013-06-29 NOTE — Anesthesia Postprocedure Evaluation (Signed)
  Anesthesia Post-op Note  Patient: Melissa Roberson  Procedure(s) Performed: Procedure(s) (LRB): APPENDECTOMY LAPAROSCOPIC (N/A)  Patient Location: PACU  Anesthesia Type: General  Level of Consciousness: awake and alert   Airway and Oxygen Therapy: Patient Spontanous Breathing  Post-op Pain: mild  Post-op Assessment: Post-op Vital signs reviewed, Patient's Cardiovascular Status Stable, Respiratory Function Stable, Patent Airway and No signs of Nausea or vomiting  Last Vitals:  Filed Vitals:   06/29/13 1238  BP: 109/66  Pulse: 66  Temp: 37.1 C  Resp: 18    Post-op Vital Signs: stable   Complications: No apparent anesthesia complications

## 2013-06-29 NOTE — H&P (Signed)
Melissa Roberson is an 28 y.o. female.   Chief Complaint: abdominal pain HPI: The pt is a 28 yo wf who presents with RLQ abdominal pain that started yesterday. Pain got more severe overnight. It has been associated with nausea and vomiting. Melissa Roberson has felt fevered and chilled. Melissa Roberson came to ER where a CT shows enlarged appendix with inflammatory change.  History reviewed. No pertinent past medical history.  Past Surgical History  Procedure Laterality Date  . Cesarean section      History reviewed. No pertinent family history. Social History:  reports that Melissa Roberson has been smoking Cigarettes.  Melissa Roberson has been smoking about 0.00 packs per day. Melissa Roberson has never used smokeless tobacco. Melissa Roberson reports that Melissa Roberson drinks alcohol. Melissa Roberson reports that Melissa Roberson does not use illicit drugs.  Allergies: No Known Allergies   (Not in a hospital admission)  Results for orders placed during the hospital encounter of 06/29/13 (from the past 48 hour(s))  URINALYSIS, ROUTINE W REFLEX MICROSCOPIC     Status: Abnormal   Collection Time    06/29/13  3:49 AM      Result Value Ref Range   Color, Urine YELLOW  YELLOW   APPearance CLOUDY (*) CLEAR   Specific Gravity, Urine 1.017  1.005 - 1.030   pH 7.5  5.0 - 8.0   Glucose, UA NEGATIVE  NEGATIVE mg/dL   Hgb urine dipstick NEGATIVE  NEGATIVE   Bilirubin Urine NEGATIVE  NEGATIVE   Ketones, ur >80 (*) NEGATIVE mg/dL   Protein, ur NEGATIVE  NEGATIVE mg/dL   Urobilinogen, UA 0.2  0.0 - 1.0 mg/dL   Nitrite NEGATIVE  NEGATIVE   Leukocytes, UA NEGATIVE  NEGATIVE   Comment: MICROSCOPIC NOT DONE ON URINES WITH NEGATIVE PROTEIN, BLOOD, LEUKOCYTES, NITRITE, OR GLUCOSE <1000 mg/dL.  PREGNANCY, URINE     Status: None   Collection Time    06/29/13  3:49 AM      Result Value Ref Range   Preg Test, Ur NEGATIVE  NEGATIVE   Comment:            THE SENSITIVITY OF THIS     METHODOLOGY IS >20 mIU/mL.  COMPREHENSIVE METABOLIC PANEL     Status: Abnormal   Collection Time    06/29/13  5:10 AM   Result Value Ref Range   Sodium 142  137 - 147 mEq/L   Potassium 4.4  3.7 - 5.3 mEq/L   Chloride 105  96 - 112 mEq/L   CO2 24  19 - 32 mEq/L   Glucose, Bld 100 (*) 70 - 99 mg/dL   BUN 9  6 - 23 mg/dL   Creatinine, Ser 0.62  0.50 - 1.10 mg/dL   Calcium 9.0  8.4 - 10.5 mg/dL   Total Protein 7.3  6.0 - 8.3 g/dL   Albumin 4.2  3.5 - 5.2 g/dL   AST 15  0 - 37 U/L   ALT 14  0 - 35 U/L   Alkaline Phosphatase 51  39 - 117 U/L   Total Bilirubin 0.5  0.3 - 1.2 mg/dL   GFR calc non Af Amer >90  >90 mL/min   GFR calc Af Amer >90  >90 mL/min   Comment: (NOTE)     The eGFR has been calculated using the CKD EPI equation.     This calculation has not been validated in all clinical situations.     eGFR's persistently <90 mL/min signify possible Chronic Kidney     Disease.  CBC WITH  DIFFERENTIAL     Status: Abnormal   Collection Time    06/29/13  5:10 AM      Result Value Ref Range   WBC 10.5  4.0 - 10.5 K/uL   RBC 4.39  3.87 - 5.11 MIL/uL   Hemoglobin 13.6  12.0 - 15.0 g/dL   HCT 39.7  36.0 - 46.0 %   MCV 90.4  78.0 - 100.0 fL   MCH 31.0  26.0 - 34.0 pg   MCHC 34.3  30.0 - 36.0 g/dL   RDW 12.1  11.5 - 15.5 %   Platelets 217  150 - 400 K/uL   Neutrophils Relative % 84 (*) 43 - 77 %   Neutro Abs 8.9 (*) 1.7 - 7.7 K/uL   Lymphocytes Relative 11 (*) 12 - 46 %   Lymphs Abs 1.2  0.7 - 4.0 K/uL   Monocytes Relative 4  3 - 12 %   Monocytes Absolute 0.4  0.1 - 1.0 K/uL   Eosinophils Relative 0  0 - 5 %   Eosinophils Absolute 0.0  0.0 - 0.7 K/uL   Basophils Relative 0  0 - 1 %   Basophils Absolute 0.0  0.0 - 0.1 K/uL  LIPASE, BLOOD     Status: None   Collection Time    06/29/13  5:10 AM      Result Value Ref Range   Lipase 26  11 - 59 U/L  I-STAT CHEM 8, ED     Status: Abnormal   Collection Time    06/29/13  5:18 AM      Result Value Ref Range   Sodium 137  137 - 147 mEq/L   Potassium 6.0 (*) 3.7 - 5.3 mEq/L   Chloride 109  96 - 112 mEq/L   BUN 10  6 - 23 mg/dL   Creatinine, Ser 0.70   0.50 - 1.10 mg/dL   Glucose, Bld 99  70 - 99 mg/dL   Calcium, Ion 0.93 (*) 1.12 - 1.23 mmol/L   TCO2 20  0 - 100 mmol/L   Hemoglobin 13.9  12.0 - 15.0 g/dL   HCT 41.0  36.0 - 46.0 %   Ct Abdomen Pelvis W Contrast  06/29/2013   CLINICAL DATA:  Abdominal pain with nausea and vomiting.  EXAM: CT ABDOMEN AND PELVIS WITH CONTRAST  TECHNIQUE: Multidetector CT imaging of the abdomen and pelvis was performed using the standard protocol following bolus administration of intravenous contrast.  CONTRAST:  26m OMNIPAQUE IOHEXOL 300 MG/ML SOLN, 1058mOMNIPAQUE IOHEXOL 300 MG/ML SOLN  COMPARISON:  None.  FINDINGS: Included view of the lung bases are clear. Visualized heart and pericardium are unremarkable.  The liver, spleen, gallbladder, pancreas and adrenal glands are unremarkable.  The stomach, small and large bowel are normal in course and caliber without inflammatory changes. Enteric contrast has not yet reached the distal small bowel. The appendix is enlarged, 10 mm in transaxial dimension. Without superimposed periappendiceal inflammatory changes. Moderate amount of retained large bowel stool. No intraperitoneal free fluid nor free air.  Kidneys are orthotopic, demonstrating symmetric enhancement. Mild to moderate right hydroureteronephrosis. No nephrolithiasis. No solid renal masses. The unopacified ureters are normal in course and caliber. Urinary bladder is partially distended and unremarkable.  Aortoiliac vessels are normal in course and caliber. No lymphadenopathy by CT size criteria. Anterior lower uterine segment Caesarean section scar. The soft tissues and included osseous structures are nonsuspicious.  IMPRESSION: Enlarged appendix without superimposed inflammatory changes, equivocal for early acute appendicitis.  Mild to moderate right hydro nephrosis without urolithiasis.   Electronically Signed   By: Elon Alas   On: 06/29/2013 05:51    Review of Systems  Constitutional: Positive for fever  and chills.  HENT: Negative.   Eyes: Negative.   Respiratory: Negative.   Cardiovascular: Negative.   Gastrointestinal: Positive for nausea, vomiting and abdominal pain.  Genitourinary: Negative.   Musculoskeletal: Negative.   Skin: Negative.   Neurological: Negative.   Endo/Heme/Allergies: Negative.   Psychiatric/Behavioral: Negative.     Blood pressure 116/74, pulse 80, temperature 98.6 F (37 C), temperature source Oral, resp. rate 16, height _0  (1.702 m), weight 160 lb (72.576 kg), last menstrual period 05/30/2013, SpO2 100.00%. Physical Exam  Constitutional: Melissa Roberson is oriented to person, place, and time. Melissa Roberson appears well-developed and well-nourished.  HENT:  Head: Normocephalic and atraumatic.  Eyes: Conjunctivae and EOM are normal. Pupils are equal, round, and reactive to light.  Neck: Normal range of motion. Neck supple.  Cardiovascular: Normal rate, regular rhythm and normal heart sounds.   Respiratory: Effort normal and breath sounds normal.  GI: Soft. Bowel sounds are normal.  There is tenderness focally in RLQ but no peritonitis  Musculoskeletal: Normal range of motion.  Neurological: Melissa Roberson is alert and oriented to person, place, and time.  Skin: Skin is warm and dry.  Psychiatric: Melissa Roberson has a normal mood and affect. Melissa Roberson behavior is normal.     Assessment/Plan The pt appears to have acute appendicitis. Because of the risk of perforation and sepsis I think Melissa Roberson would benefit from appendectomy. I have discussed with Melissa Roberson the risks and benefits of surgery as well as some of the technical aspects including the risk of leak and Melissa Roberson understands and wishes to proceed.  Luella Cook III 06/29/2013, 8:51 AM

## 2013-06-29 NOTE — ED Provider Notes (Signed)
CSN: [id number]     Arrival date & time 06/29/13  0307 History   First MD Initiated Contact with Patient 06/29/13 0321     Chief Complaint  Patient presents with  . Abdominal Pain     (Consider location/radiation/quality/duration/timing/severity/associated sxs/prior Treatment) HPI Comments: 28 year old female with C-section history presents with nausea and gradually worsening central and right sided abdominal pain since since last night. No history of similar. Patient vomited, nonbilious. Nonradiating pain. Minimal alcohol use. No other abdominal surgery history. Pain is constant severe ache  Patient is a 28 y.o. female presenting with abdominal pain. The history is provided by the patient.  Abdominal Pain Associated symptoms: nausea and vomiting   Associated symptoms: no chest pain, no chills, no dysuria, no fever and no shortness of breath     History reviewed. No pertinent past medical history. Past Surgical History  Procedure Laterality Date  . Cesarean section     History reviewed. No pertinent family history. History  Substance Use Topics  . Smoking status: Light Tobacco Smoker    Types: Cigarettes  . Smokeless tobacco: Never Used     Comment: Only smokes when drinks ETOH  . Alcohol Use: Yes     Comment: Socially   OB History   Grav Para Term Preterm Abortions TAB SAB Ect Mult Living                 Review of Systems  Constitutional: Positive for appetite change. Negative for fever and chills.  HENT: Negative for congestion.   Eyes: Negative for visual disturbance.  Respiratory: Negative for shortness of breath.   Cardiovascular: Negative for chest pain.  Gastrointestinal: Positive for nausea, vomiting and abdominal pain.  Genitourinary: Negative for dysuria and flank pain.  Musculoskeletal: Negative for back pain, neck pain and neck stiffness.  Skin: Negative for rash.  Neurological: Positive for light-headedness. Negative for headaches.      Allergies   Review of patient's allergies indicates no known allergies.  Home Medications   Prior to Admission medications   Medication Sig Start Date End Date Taking? Authorizing Provider  ibuprofen (ADVIL,MOTRIN) 200 MG tablet Take 800 mg by mouth every 6 (six) hours as needed for pain.   Yes Historical Provider, MD  Multiple Vitamin (MULTIVITAMIN) capsule Take 1 capsule by mouth daily.   Yes Historical Provider, MD   BP 123/76  Pulse 93  Temp(Src) 98.6 F (37 C) (Oral)  Resp 18  Ht 5\' 7"  (1.702 m)  Wt 160 lb (72.576 kg)  BMI 25.05 kg/m2  SpO2 99%  LMP 05/30/2013 Physical Exam  Nursing note and vitals reviewed. Constitutional: She is oriented to person, place, and time. She appears well-developed and well-nourished.  HENT:  Head: Normocephalic and atraumatic.  Dry mucous membranes  Eyes: Conjunctivae are normal. Right eye exhibits no discharge. Left eye exhibits no discharge.  Neck: Normal range of motion. Neck supple. No tracheal deviation present.  Cardiovascular: Normal rate and regular rhythm.   Pulmonary/Chest: Effort normal and breath sounds normal.  Abdominal: Soft. She exhibits no distension. There is tenderness (right mid and right lower quadrant). There is no guarding.  Musculoskeletal: She exhibits no edema.  Neurological: She is alert and oriented to person, place, and time.  Skin: Skin is warm. No rash noted.  Psychiatric: She has a normal mood and affect.    ED Course  Procedures (including critical care time) Labs Review Labs Reviewed  URINALYSIS, ROUTINE W REFLEX MICROSCOPIC - Abnormal; Notable for the following:  APPearance CLOUDY (*)    Ketones, ur >80 (*)    All other components within normal limits  COMPREHENSIVE METABOLIC PANEL - Abnormal; Notable for the following:    Glucose, Bld 100 (*)    All other components within normal limits  CBC WITH DIFFERENTIAL - Abnormal; Notable for the following:    Neutrophils Relative % 84 (*)    Neutro Abs 8.9 (*)     Lymphocytes Relative 11 (*)    All other components within normal limits  I-STAT CHEM 8, ED - Abnormal; Notable for the following:    Potassium 6.0 (*)    Calcium, Ion 0.93 (*)    All other components within normal limits  PREGNANCY, URINE  LIPASE, BLOOD    Imaging Review Ct Abdomen Pelvis W Contrast  06/29/2013   CLINICAL DATA:  Abdominal pain with nausea and vomiting.  EXAM: CT ABDOMEN AND PELVIS WITH CONTRAST  TECHNIQUE: Multidetector CT imaging of the abdomen and pelvis was performed using the standard protocol following bolus administration of intravenous contrast.  CONTRAST:  6mL OMNIPAQUE IOHEXOL 300 MG/ML SOLN, 144mL OMNIPAQUE IOHEXOL 300 MG/ML SOLN  COMPARISON:  None.  FINDINGS: Included view of the lung bases are clear. Visualized heart and pericardium are unremarkable.  The liver, spleen, gallbladder, pancreas and adrenal glands are unremarkable.  The stomach, small and large bowel are normal in course and caliber without inflammatory changes. Enteric contrast has not yet reached the distal small bowel. The appendix is enlarged, 10 mm in transaxial dimension. Without superimposed periappendiceal inflammatory changes. Moderate amount of retained large bowel stool. No intraperitoneal free fluid nor free air.  Kidneys are orthotopic, demonstrating symmetric enhancement. Mild to moderate right hydroureteronephrosis. No nephrolithiasis. No solid renal masses. The unopacified ureters are normal in course and caliber. Urinary bladder is partially distended and unremarkable.  Aortoiliac vessels are normal in course and caliber. No lymphadenopathy by CT size criteria. Anterior lower uterine segment Caesarean section scar. The soft tissues and included osseous structures are nonsuspicious.  IMPRESSION: Enlarged appendix without superimposed inflammatory changes, equivocal for early acute appendicitis.  Mild to moderate right hydro nephrosis without urolithiasis.   Electronically Signed   By: Elon Alas   On: 06/29/2013 05:51     EKG Interpretation None      MDM   Final diagnoses:  None   Clinical concern for appendicitis versus cholecystitis procedure infection versus other. Based on exam and presentation most likely concern for appendicitis. Bedside ultrasound done of gallbladder which did not show wall thickening or gallstones. CT scan ordered. Blood work reviewed unremarkable. Fluids IV and Zofran given. CT scan shows likely early appendicitis which fits clinically the picture. Spoke with general surgery to evaluate the patient. Updated patient and pain control at this time. Normal saline infusion started. N.p.o. Discussed.  Signed out to followup surgery recommendations with likely plan for OR. Appendicitis, right lower abdominal pain     Mariea Clonts, MD 06/29/13 [phone]

## 2013-06-29 NOTE — Transfer of Care (Signed)
Immediate Anesthesia Transfer of Care Note  Patient: Melissa Roberson  Procedure(s) Performed: Procedure(s): APPENDECTOMY LAPAROSCOPIC (N/A)  Patient Location: PACU  Anesthesia Type:General  Level of Consciousness: awake, alert , oriented, sedated and patient cooperative  Airway & Oxygen Therapy: Patient Spontanous Breathing and Patient connected to face mask oxygen  Post-op Assessment: Report given to PACU RN and Post -op Vital signs reviewed and stable  Post vital signs: stable  Complications: No apparent anesthesia complications

## 2013-06-29 NOTE — Anesthesia Preprocedure Evaluation (Signed)
Anesthesia Evaluation  Patient identified by MRN, date of birth, ID band Patient awake    Reviewed: Allergy & Precautions, H&P , NPO status , Patient's Chart, lab work & pertinent test results  Airway Mallampati: II TM Distance: >3 FB Neck ROM: Full    Dental no notable dental hx.    Pulmonary Current Smoker,  breath sounds clear to auscultation  Pulmonary exam normal       Cardiovascular negative cardio ROS  Rhythm:Regular Rate:Normal     Neuro/Psych negative neurological ROS  negative psych ROS   GI/Hepatic negative GI ROS, Neg liver ROS,   Endo/Other  negative endocrine ROS  Renal/GU negative Renal ROS  negative genitourinary   Musculoskeletal negative musculoskeletal ROS (+)   Abdominal   Peds negative pediatric ROS (+)  Hematology negative hematology ROS (+)   Anesthesia Other Findings   Reproductive/Obstetrics negative OB ROS                           Anesthesia Physical Anesthesia Plan  ASA: II and emergent  Anesthesia Plan: General   Post-op Pain Management:    Induction: Intravenous and Rapid sequence  Airway Management Planned: Oral ETT  Additional Equipment:   Intra-op Plan:   Post-operative Plan: Extubation in OR  Informed Consent: I have reviewed the patients History and Physical, chart, labs and discussed the procedure including the risks, benefits and alternatives for the proposed anesthesia with the patient or authorized representative who has indicated his/her understanding and acceptance.   Dental advisory given  Plan Discussed with: CRNA and Surgeon  Anesthesia Plan Comments:         Anesthesia Quick Evaluation

## 2013-06-29 NOTE — Op Note (Signed)
06/29/2013  11:21 AM  PATIENT:  Melissa Roberson  28 y.o. female  PRE-OPERATIVE DIAGNOSIS:  Appendicitis  POST-OPERATIVE DIAGNOSIS:  Appendicitis  PROCEDURE:  Procedure(s): APPENDECTOMY LAPAROSCOPIC (N/A)  SURGEON:  Surgeon(s) and Role:    * Merrie Roof, MD - Primary  PHYSICIAN ASSISTANT:   ASSISTANTS: none   ANESTHESIA:   general  EBL:  Total I/O In: 0  Out: 20 [Urine:10; Blood:10]  BLOOD ADMINISTERED:none  DRAINS: none   LOCAL MEDICATIONS USED:  MARCAINE     SPECIMEN:  Source of Specimen:  appendix  DISPOSITION OF SPECIMEN:  PATHOLOGY  COUNTS:  YES  TOURNIQUET:  * No tourniquets in log *  DICTATION: .Dragon Dictation After informed consent was obtained patient was brought to the operating room placed in the supine position on the operating room table. After adequate induction of general anesthesia the patient's abdomen was prepped with ChloraPrep, allowed to dry, and draped in usual sterile manner. The area below the umbilicus was infiltrated with quarter percent Marcaine. A small incision was made with a 15 blade knife. This incision was carried down through the subcutaneous tissue bluntly with a hemostat and [employer] retractors until the linea alba was identified. The linea alba was incised with a 15 blade knife. Each side was grasped Coker clamps and elevated anteriorly. The preperitoneal space was probed bluntly with a hemostat until the peritoneum was opened and access was gained to the abdominal cavity. A 0 Vicryl purse string stitch was placed in the fascia surrounding the opening. A Hassan cannula was placed through the opening and anchored in place with the previously placed Vicryl purse string stitch. The laparoscope was placed through the [facility] cannula. The abdomen was insufflated with carbon dioxide without difficulty. Next the suprapubic area was infiltrated with quarter percent Marcaine. A small incision was made with a 15 blade knife. A 5 mm port was placed  bluntly through this incision into the abdominal cavity. A site was then chosen in the upper abdomen for placement of a 5 mm port. The area was infiltrated with quarter percent Marcaine. A small stab incision was made with a 15 blade knife. A 5 mm port was placed bluntly through this incision and the abdominal cavity under direct vision. The laparoscope was then moved to the suprapubic port. Using a Glassman grasper and harmonic scalpel the right lower quadrant was inspected. The appendix was readily identified. The appendix was elevated anteriorly and the mesoappendix was taken down sharply with the harmonic scalpel. Once the base of the appendix where it joined the cecum was identified and cleared of any tissue then a laparoscopic GIA blue load 6 row stapler was placed through the [facility] cannula. The stapler was placed across the base of the appendix clamped and fired thereby dividing the base of the appendix between staple lines. There was a bleeding vessel from the staple line that was controlled with a clip. A laparoscopic bag was then inserted through the [facility] cannula. The appendix was placed within the bag and the bag was sealed. The abdomen was then irrigated with copious amounts of saline until the effluent was clear. No other abnormalities were noted. The appendix and bag were removed with the [facility] cannula through the infraumbilical port without difficulty. The fascial defect was closed with the previously placed Vicryl pursestring stitch as well as with another interrupted 0 Vicryl figure-of-eight stitch. The rest of the ports were removed under direct vision and were found to be hemostatic. The gas was allowed  to escape. The skin incisions were closed with interrupted 4-0 Monocryl subcuticular stitches. Dermabond dressings were applied. The patient tolerated the procedure well. At the end of the case all needle sponge and instrument counts were correct. The patient was then awakened and taken to  recovery in stable condition.  PLAN OF CARE: Admit for overnight observation  PATIENT DISPOSITION:  PACU - hemodynamically stable.   Delay start of Pharmacological VTE agent (>24hrs) due to surgical blood loss or risk of bleeding: not applicable

## 2013-06-29 NOTE — ED Notes (Signed)
MD at bedside. 

## 2013-06-29 NOTE — Progress Notes (Signed)
pacu nursing: crna in to do i-stat to double check K+ level Na + 143 K+ 3.6 Glucose 77  HCT 38 Hb 12.9

## 2013-06-29 NOTE — Addendum Note (Signed)
Addendum created 06/29/13 1355 by Sherry Ruffing, CRNA   Modules edited: Anesthesia Medication Administration

## 2013-06-29 NOTE — ED Notes (Signed)
Patient is alert and oriented x3.  She is complaining of upper abdominal pain that started last night. Currently she rates her pain 10 of 10 with nausea and vomiting.  She denies having this issue before.

## 2013-06-30 ENCOUNTER — Encounter (HOSPITAL_COMMUNITY): Payer: Self-pay | Admitting: General Surgery

## 2013-06-30 LAB — POCT I-STAT 4, (NA,K, GLUC, HGB,HCT)
Glucose, Bld: 77 mg/dL (ref 70–99)
HCT: 38 % (ref 36.0–46.0)
Hemoglobin: 12.9 g/dL (ref 12.0–15.0)
Potassium: 3.6 mEq/L — ABNORMAL LOW (ref 3.7–5.3)
SODIUM: 143 meq/L (ref 137–147)

## 2013-06-30 MED ORDER — ENSURE COMPLETE PO LIQD: 237.0000 mL | Freq: Two times a day (BID) | ORAL | Status: DC

## 2013-06-30 MED ORDER — IBUPROFEN 800 MG PO TABS: ORAL_TABLET | ORAL | Status: AC

## 2013-06-30 MED ORDER — OXYCODONE-ACETAMINOPHEN 5-325 MG PO TABS: 1.0000 | ORAL_TABLET | ORAL | Status: DC | PRN

## 2013-06-30 MED ORDER — ACETAMINOPHEN 325 MG PO TABS: 650.0000 mg | ORAL_TABLET | Freq: Four times a day (QID) | ORAL | Status: AC | PRN

## 2013-06-30 MED ORDER — IBUPROFEN 600 MG PO TABS
600.0000 mg | ORAL_TABLET | Freq: Four times a day (QID) | ORAL | Status: DC | PRN
  Filled 2013-06-30: qty 1

## 2013-06-30 MED ORDER — ACETAMINOPHEN 325 MG PO TABS: 650.0000 mg | ORAL_TABLET | Freq: Four times a day (QID) | ORAL | Status: DC | PRN

## 2013-06-30 MED ORDER — IBUPROFEN 200 MG PO TABS: ORAL_TABLET | ORAL | Status: DC

## 2013-06-30 MED ORDER — OXYCODONE-ACETAMINOPHEN 5-325 MG PO TABS
1.0000 | ORAL_TABLET | ORAL | Status: DC | PRN
  Administered 2013-06-30: 2 via ORAL
  Filled 2013-06-30: qty 2

## 2013-06-30 NOTE — Discharge Instructions (Signed)
Laparoscopic Appendectomy Care After Refer to this sheet in the next few weeks. These instructions provide you with information on caring for yourself after your procedure. Your caregiver may also give you more specific instructions. Your treatment has been planned according to current medical practices, but problems sometimes occur. Call your caregiver if you have any problems or questions after your procedure. HOME CARE INSTRUCTIONS  Do not drive while taking narcotic pain medicines.  Use stool softener if you become constipated from your pain medicines.  Change your bandages (dressings) as directed.  Keep your wounds clean and dry. You may wash the wounds gently with soap and water. Gently pat the wounds dry with a clean towel.  Do not take baths, swim, or use hot tubs for 10 days, or as instructed by your caregiver.  Only take over-the-counter or prescription medicines for pain, discomfort, or fever as directed by your caregiver.  You may continue your normal diet as directed.  Do not lift more than 10 pounds (4.5 kg) or play contact sports for 3 weeks, or as directed.  Slowly increase your activity after surgery.  Take deep breaths to avoid getting a lung infection (pneumonia). SEEK MEDICAL CARE IF:  You have redness, swelling, or increasing pain in your wounds.  You have pus coming from your wounds.  You have drainage from a wound that lasts longer than 1 day.  You notice a bad smell coming from the wounds or dressing.  Your wound edges break open after stitches (sutures) have been removed.  You notice increasing pain in the shoulders (shoulder strap areas) or near your shoulder blades.  You develop dizzy episodes or fainting while standing.  You develop shortness of breath.  You develop persistent nausea or vomiting.  You cannot control your bowel functions or lose your appetite.  You develop diarrhea. SEEK IMMEDIATE MEDICAL CARE IF:   You have a fever.  You  develop a rash.  You have difficulty breathing or sharp pains in your chest.  You develop any reaction or side effects to medicines given. MAKE SURE YOU:  Understand these instructions.  Will watch your condition.  Will get help right away if you are not doing well or get worse. Document Released: 02/06/2005 Document Revised: 05/01/2011 Document Reviewed: 08/16/2010 [facility] Patient Information 2014 [place], [place].  CCS ______[facility] LAPAROSCOPIC SURGERY: POST OP INSTRUCTIONS Always review your discharge instruction sheet given to you by the facility where your surgery was performed. IF YOU HAVE DISABILITY OR FAMILY LEAVE FORMS, YOU MUST BRING THEM TO THE OFFICE FOR PROCESSING.   DO NOT GIVE THEM TO YOUR DOCTOR.  1. A prescription for pain medication may be given to you upon discharge.  Take your pain medication as prescribed, if needed.  If narcotic pain medicine is not needed, then you may take acetaminophen (Tylenol) or ibuprofen (Advil) as needed. 2. Take your usually prescribed medications unless otherwise directed. 3. If you need a refill on your pain medication, please contact your pharmacy.  They will contact our office to request authorization. Prescriptions will not be filled after 5pm or on week-ends. 4. You should follow a light diet the first few days after arrival home, such as soup and crackers, etc.  Be sure to include lots of fluids daily. 5. Most patients will experience some swelling and bruising in the area of the incisions.  Ice packs will help.  Swelling and bruising can take several days to resolve.  6. It is common to experience  some constipation if taking pain medication after surgery.  Increasing fluid intake and taking a stool softener (such as Colace) will usually help or prevent this problem from occurring.  A mild laxative (Milk of Magnesia or Miralax) should be taken according to package instructions if there are no bowel movements  after 48 hours. 7. Unless discharge instructions indicate otherwise, you may remove your bandages 24-48 hours after surgery, and you may shower at that time.  You may have steri-strips (small skin tapes) in place directly over the incision.  These strips should be left on the skin for 7-10 days.  If your surgeon used skin glue on the incision, you may shower in 24 hours.  The glue will flake off over the next 2-3 weeks.  Any sutures or staples will be removed at the office during your follow-up visit. 8. ACTIVITIES:  You may resume regular (light) daily activities beginning the next day--such as daily self-care, walking, climbing stairs--gradually increasing activities as tolerated.  You may have sexual intercourse when it is comfortable.  Refrain from any heavy lifting or straining until approved by your doctor. a. You may drive when you are no longer taking prescription pain medication, you can comfortably wear a seatbelt, and you can safely maneuver your car and apply brakes. b. RETURN TO WORK:  __________________________________________________________ 9. You should see your doctor in the office for a follow-up appointment approximately 2-3 weeks after your surgery.  Make sure that you call for this appointment within a day or two after you arrive home to insure a convenient appointment time. 10. OTHER INSTRUCTIONS: __________________________________________________________________________________________________________________________ __________________________________________________________________________________________________________________________ WHEN TO CALL YOUR DOCTOR: 1. Fever over 101.0 2. Inability to urinate 3. Continued bleeding from incision. 4. Increased pain, redness, or drainage from the incision. 5. Increasing abdominal pain  The clinic staff is available to answer your questions during regular business hours.  Please dont hesitate to call and ask to speak to one of the  nurses for clinical concerns.  If you have a medical emergency, go to the nearest emergency room or call 911.  A surgeon from [facility] is always on call at the hospital. [address], [place], [place], [place]  [address] ? [address], [place],    [address] [phone] ? [phone] ? FAX [phone] Web site: [email or web address]

## 2013-06-30 NOTE — Progress Notes (Signed)
Discharge instructions and prescription given.  No questions asked.  Verbalized understanding.  Left via wheelchair with husband.  Suzan Slick

## 2013-06-30 NOTE — Progress Notes (Signed)
1 Day Post-Op  Subjective: She is doing well, kind of sore, just on clears,. Not sure she is ready for PO's.  Sites look fine, but she feels puffy.  Objective: Vital signs in last 24 hours: Temp:  [98.3 F (36.8 C)-99.1 F (37.3 C)] 98.3 F (36.8 C) (05/11 0601) Pulse Rate:  [60-83] 77 (05/11 0601) Resp:  [16-24] 16 (05/11 0601) BP: (94-125)/(56-74) 94/60 mmHg (05/11 0601) SpO2:  [97 %-100 %] 99 % (05/11 0601)  PO not recorded.   Diet: clears Afebrile, VSS No labs Intake/Output from previous day: 05/10 0701 - 05/11 0700 In: 1000 [I.V.:1000] Out: 810 [Urine:800; Blood:10] Intake/Output this shift:    General appearance: alert, cooperative and no distress GI: soft sore, no distension, port sites look fine.  Lab Results:   Recent Labs  06/29/13 0510 06/29/13 0518 06/29/13 1227  WBC 10.5  --   --   HGB 13.6 13.9 12.9  HCT 39.7 41.0 38.0  PLT 217  --   --     BMET  Recent Labs  06/29/13 0510 06/29/13 0518 06/29/13 1227  NA 142 137 143  K 4.4 6.0* 3.6*  CL 105 109  --   CO2 24  --   --   GLUCOSE 100* 99 77  BUN 9 10  --   CREATININE 0.62 0.70  --   CALCIUM 9.0  --   --    PT/INR No results found for this basename: LABPROT, INR,  in the last 72 hours   Recent Labs Lab 06/29/13 0510  AST 15  ALT 14  ALKPHOS 51  BILITOT 0.5  PROT 7.3  ALBUMIN 4.2     Lipase     Component Value Date/Time   LIPASE 26 06/29/2013 0510     Studies/Results: Ct Abdomen Pelvis W Contrast  06/29/2013   CLINICAL DATA:  Abdominal pain with nausea and vomiting.  EXAM: CT ABDOMEN AND PELVIS WITH CONTRAST  TECHNIQUE: Multidetector CT imaging of the abdomen and pelvis was performed using the standard protocol following bolus administration of intravenous contrast.  CONTRAST:  80mL OMNIPAQUE IOHEXOL 300 MG/ML SOLN, 129mL OMNIPAQUE IOHEXOL 300 MG/ML SOLN  COMPARISON:  None.  FINDINGS: Included view of the lung bases are clear. Visualized heart and pericardium are unremarkable.   The liver, spleen, gallbladder, pancreas and adrenal glands are unremarkable.  The stomach, small and large bowel are normal in course and caliber without inflammatory changes. Enteric contrast has not yet reached the distal small bowel. The appendix is enlarged, 10 mm in transaxial dimension. Without superimposed periappendiceal inflammatory changes. Moderate amount of retained large bowel stool. No intraperitoneal free fluid nor free air.  Kidneys are orthotopic, demonstrating symmetric enhancement. Mild to moderate right hydroureteronephrosis. No nephrolithiasis. No solid renal masses. The unopacified ureters are normal in course and caliber. Urinary bladder is partially distended and unremarkable.  Aortoiliac vessels are normal in course and caliber. No lymphadenopathy by CT size criteria. Anterior lower uterine segment Caesarean section scar. The soft tissues and included osseous structures are nonsuspicious.  IMPRESSION: Enlarged appendix without superimposed inflammatory changes, equivocal for early acute appendicitis.  Mild to moderate right hydro nephrosis without urolithiasis.   Electronically Signed   By: Elon Alas   On: 06/29/2013 05:51    Medications: . pantoprazole (PROTONIX) IV  40 mg Intravenous QHS    Assessment/Plan Appendicitis PROCEDURE: APPENDECTOMY LAPAROSCOPIC (N/A) SURGEON: Merrie Roof, MD, 06/29/2013. Hx of C section   Plan:  Advance diet, ambulate and if she  is OK home after lunch.    LOS: 1 day    Earnstine Regal 06/30/2013

## 2013-06-30 NOTE — Progress Notes (Signed)
INITIAL NUTRITION ASSESSMENT  DOCUMENTATION CODES Per approved criteria  -Not Applicable   INTERVENTION: - Ensure Complete BID - Encouraged increased meal intake - Will continue to monitor   NUTRITION DIAGNOSIS: Inadequate oral intake related to poor appetite as evidenced by pt report.    Goal: Pt to consume >90% of meals/supplements  Monitor:  Weights, labs, intake  Reason for Assessment: Malnutrition screening tool   28 y.o. female  Admitting Dx: Appendicitis  ASSESSMENT: Pt presents with RLQ abdominal pain that started 06/28/13. Pain got more severe overnight. It has been associated with nausea and vomiting. She has felt fevered and chilled. She came to ER where a CT shows enlarged appendix with inflammatory change. Had appendectomy yesterday.  Met with pt who reports she was eating smaller portions for the past month, trying to lose weight, and lost 5 pounds in the past month. States her appetite has also been down from not feeling well. Was able to eat some grits and toast this morning - denied any nausea/vomiting. C/o having gas. Agreeable to getting Ensure Complete. Trying to rest, did not perform nutrition focused physical exam.   Potassium low, was high yesterday.    Height: Ht Readings from Last 1 Encounters:  06/29/13 5' 7"  (1.702 m)    Weight: Wt Readings from Last 1 Encounters:  06/29/13 160 lb (72.576 kg)    Ideal Body Weight: 135 lbs   % Ideal Body Weight: 118%  Wt Readings from Last 10 Encounters:  06/29/13 160 lb (72.576 kg)  06/29/13 160 lb (72.576 kg)    Usual Body Weight: 165 lbs per pt  % Usual Body Weight: 97%  BMI:  Body mass index is 25.05 kg/(m^2).  Estimated Nutritional Needs: Kcal: 1500-1700 Protein: 70-85g Fluid: 1.5-1.7L/day   Skin: Abdominal incision   Diet Order: General  EDUCATION NEEDS: -No education needs identified at this time   Intake/Output Summary (Last 24 hours) at 06/30/13 1142 Last data filed at 06/29/13  1207  Gross per 24 hour  Intake    150 ml  Output     50 ml  Net    100 ml    Last BM: PTA  Labs:   Recent Labs Lab 06/29/13 0510 06/29/13 0518 06/29/13 1227  NA 142 137 143  K 4.4 6.0* 3.6*  CL 105 109  --   CO2 24  --   --   BUN 9 10  --   CREATININE 0.62 0.70  --   CALCIUM 9.0  --   --   GLUCOSE 100* 99 77    CBG (last 3)  No results found for this basename: GLUCAP,  in the last 72 hours  Scheduled Meds: . pantoprazole (PROTONIX) IV  40 mg Intravenous QHS    Continuous Infusions:   Past Medical History  Diagnosis Date  . Anxiety     Past Surgical History  Procedure Laterality Date  . Cesarean section      Mikey College MS, [place], [place] Pager [phone] After Hours Pager

## 2013-06-30 NOTE — Progress Notes (Signed)
Agree with PA note above.  Edward Jolly MD, FACS  06/30/2013 10:30 AM

## 2013-06-30 NOTE — Discharge Summary (Signed)
.   Physician Discharge Summary  Patient ID: Melissa Roberson MRN: [id number] DOB/AGE: January 08, 1986 28 y.o.  Admit date: 06/29/2013 Discharge date: 06/30/2013  Admission Diagnoses:  Appendicitis Hx of C section x 2.   Discharge Diagnoses:  Same Principal Problem:   Appendicitis   PROCEDURES: APPENDECTOMY LAPAROSCOPIC, SURGEON: Merrie Roof, MD, 06/29/2013.   Hospital Course: The pt is a 28 yo wf who presents with RLQ abdominal pain that started yesterday. Pain got more severe overnight. It has been associated with nausea and vomiting. She has felt fevered and chilled. She came to ER where a CT shows enlarged appendix with inflammatory change. She was seen in the ED by Dr. Marlou Starks and taken to the OR later that morning.  She tolerated the procedure well.  We advanced her diet, walked her some and we plan discharge this afternoon if she does well with lunch.  Follow up is scheduled and I have answered her questions.  Condition on d/c:  Improved            Disposition: 01-Home or Self Care   Future Appointments Provider Department Dept Phone   07/15/2013 2:00 PM [facility], [place] [phone]       Medication List         acetaminophen 325 MG tablet  Commonly known as:  TYLENOL  Take 2 tablets (650 mg total) by mouth every 6 (six) hours as needed for mild pain, moderate pain, fever or headache (You can only take 4000 mg of Tylenol(acetaminophen) per day, this is in your prescribed pain medicine so you have to add that to any other you take.).     ibuprofen 800 MG tablet  Commonly known as:  ADVIL,MOTRIN  You can take one of these as directed, or you can get over the counter Ibuprofen, and use 2-3 tablets every 6 hours as needed.     multivitamin capsule  Take 1 capsule by mouth daily.     oxyCODONE-acetaminophen 5-325 MG per tablet  Commonly known as:  PERCOCET/ROXICET  Take 1-2 tablets by mouth every 4 (four) hours as needed for  moderate pain.           Follow-up Information   Follow up with [facility] On 07/15/2013. (You have a 2 PM appointment, be there 30 minutes before for check in.)    Contact information:   [address] [place]   [place] [address] [phone]       Signed: Earnstine Regal 06/30/2013, 1:48 PM

## 2013-07-04 ENCOUNTER — Telehealth (INDEPENDENT_AMBULATORY_CARE_PROVIDER_SITE_OTHER): Payer: Self-pay

## 2013-07-04 NOTE — Telephone Encounter (Signed)
LMOV. Appt moved from [facility] to Dr. Marlou Starks 07/07/13.

## 2013-07-04 NOTE — Telephone Encounter (Signed)
Patient states DR. Marlou Starks had spoke to her yesterday about her Path results and she does not remember what he said, she is asking for him to call her . Advised her DR. Marlou Starks is in [place] today it may be Monday .

## 2013-07-07 ENCOUNTER — Encounter (INDEPENDENT_AMBULATORY_CARE_PROVIDER_SITE_OTHER): Payer: Self-pay | Admitting: General Surgery

## 2013-07-07 ENCOUNTER — Ambulatory Visit (INDEPENDENT_AMBULATORY_CARE_PROVIDER_SITE_OTHER): Payer: BC Managed Care – PPO | Admitting: General Surgery

## 2013-07-07 VITALS — BP 124/76 | HR 68 | Temp 98.2°F | Resp 16 | Ht 67.0 in | Wt 163.8 lb

## 2013-07-07 DIAGNOSIS — K37 Unspecified appendicitis: Secondary | ICD-10-CM

## 2013-07-07 DIAGNOSIS — C7A00 Malignant carcinoid tumor of unspecified site: Secondary | ICD-10-CM

## 2013-07-07 NOTE — Progress Notes (Signed)
Subjective:     Patient ID: Melissa Roberson, female   DOB: 1985/06/18, 28 y.o.   MRN: [id number]  HPI The patient is a 28 year old white female who is about 2 weeks status post laparoscopic appendectomy for appendicitis. Incidentally she was found to have a 7 mm carcinoid in her appendix. Her margins were all clean. The carcinoid was low-grade. She is doing well and denies any abdominal pain. Her appetite is slowly improving. She has also had some constipation but this seems to be improving. She has been taking Colace and MiraLAX.  Review of Systems     Objective:   Physical Exam On exam her abdomen is soft and nontender. Her incisions are all healing nicely with no sign of infection    Assessment:     The patient is 2 weeks status post laparoscopic appendectomy for appendicitis with an incidentally found carcinoid     Plan:     At this point I will refer her to medical oncology to see if anything else needs to be done. I suspect that this will likely be a curative operation since this was found incidentally With clean margins. I will plan to see her back in about a month to check her progress

## 2013-07-07 NOTE — Patient Instructions (Signed)
Will refer to oncology

## 2013-07-11 ENCOUNTER — Telehealth: Payer: Self-pay | Admitting: Internal Medicine

## 2013-07-11 NOTE — Telephone Encounter (Signed)
S/W PATIENT AND GAVE NP APPT FOR 05/27 @ 1:30 W/DR. MOHAMED.  REFERRING DR. 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

## 2013-07-15 ENCOUNTER — Encounter (INDEPENDENT_AMBULATORY_CARE_PROVIDER_SITE_OTHER): Payer: Self-pay

## 2013-07-16 ENCOUNTER — Encounter: Payer: Self-pay | Admitting: Internal Medicine

## 2013-07-16 ENCOUNTER — Ambulatory Visit: Payer: BC Managed Care – PPO

## 2013-07-16 ENCOUNTER — Other Ambulatory Visit (HOSPITAL_BASED_OUTPATIENT_CLINIC_OR_DEPARTMENT_OTHER): Payer: BC Managed Care – PPO

## 2013-07-16 ENCOUNTER — Other Ambulatory Visit: Payer: Self-pay | Admitting: Medical Oncology

## 2013-07-16 ENCOUNTER — Telehealth: Payer: Self-pay | Admitting: Internal Medicine

## 2013-07-16 ENCOUNTER — Ambulatory Visit (HOSPITAL_BASED_OUTPATIENT_CLINIC_OR_DEPARTMENT_OTHER): Payer: BC Managed Care – PPO | Admitting: Internal Medicine

## 2013-07-16 VITALS — BP 127/76 | HR 76 | Temp 98.7°F | Resp 18 | Ht 67.0 in | Wt 166.3 lb

## 2013-07-16 DIAGNOSIS — C7A020 Malignant carcinoid tumor of the appendix: Secondary | ICD-10-CM

## 2013-07-16 DIAGNOSIS — C7A00 Malignant carcinoid tumor of unspecified site: Secondary | ICD-10-CM

## 2013-07-16 LAB — COMPREHENSIVE METABOLIC PANEL (CC13)
ALBUMIN: 4.2 g/dL (ref 3.5–5.0)
ALK PHOS: 59 U/L (ref 40–150)
ALT: 14 U/L (ref 0–55)
AST: 15 U/L (ref 5–34)
Anion Gap: 10 mEq/L (ref 3–11)
BUN: 11.1 mg/dL (ref 7.0–26.0)
CO2: 24 mEq/L (ref 22–29)
Calcium: 9.4 mg/dL (ref 8.4–10.4)
Chloride: 105 mEq/L (ref 98–109)
Creatinine: 0.7 mg/dL (ref 0.6–1.1)
Glucose: 87 mg/dl (ref 70–140)
POTASSIUM: 4.1 meq/L (ref 3.5–5.1)
Sodium: 140 mEq/L (ref 136–145)
Total Bilirubin: 0.26 mg/dL (ref 0.20–1.20)
Total Protein: 7.1 g/dL (ref 6.4–8.3)

## 2013-07-16 LAB — CBC WITH DIFFERENTIAL/PLATELET
BASO%: 0.2 % (ref 0.0–2.0)
BASOS ABS: 0 10*3/uL (ref 0.0–0.1)
EOS%: 1.9 % (ref 0.0–7.0)
Eosinophils Absolute: 0.1 10*3/uL (ref 0.0–0.5)
HCT: 40.4 % (ref 34.8–46.6)
HGB: 13.4 g/dL (ref 11.6–15.9)
LYMPH%: 29 % (ref 14.0–49.7)
MCH: 30.4 pg (ref 25.1–34.0)
MCHC: 33.3 g/dL (ref 31.5–36.0)
MCV: 91.2 fL (ref 79.5–101.0)
MONO#: 0.5 10*3/uL (ref 0.1–0.9)
MONO%: 7.4 % (ref 0.0–14.0)
NEUT#: 4.3 10*3/uL (ref 1.5–6.5)
NEUT%: 61.5 % (ref 38.4–76.8)
Platelets: 260 10*3/uL (ref 145–400)
RBC: 4.42 10*6/uL (ref 3.70–5.45)
RDW: 12.7 % (ref 11.2–14.5)
WBC: 7 10*3/uL (ref 3.9–10.3)
lymph#: 2 10*3/uL (ref 0.9–3.3)

## 2013-07-16 NOTE — Progress Notes (Signed)
[place] Telephone:([phone]) [phone]   Fax:(336) [phone]  CONSULT NOTE  REFERRING PHYSICIAN: Dr. Autumn Messing.  REASON FOR CONSULTATION:  28 years old white female recently diagnosed with appendiceal carcinoid tumor.  HPI Melissa Roberson is a 28 y.o. female with no significant past medical history except for frequent urinary tract infection. The patient presented to the emergency Department at [facility] on 06/29/2013 complaining of 1 day right lower quadrant abdominal pain. It is getting severe overnight and associated with nausea and vomiting as well as fever and chills. CT of the abdomen performed on 06/29/2013 and it showed enlarged appendix without superimposed inflammatory changes equivocal for early acute appendicitis. On 06/29/2013 the patient underwent laparoscopic appendectomy under the care of Dr. Marlou Starks. The final pathology (Accession: [id number]) showed low grade neuroendocrine tumor (carcinoid tumor) associated with acute suppurative appendicitis. The tumor focally extended through the muscularis propria to the subserosa. The proximal and mesenteric margins were free of tumor. The tumor size measured 0.7 CM with histologic grade G1. The distance of tumor from the closest margin was 0.3 CM from the mesenteric margin, no lymphovascular or perineural invasion identified. The final pathologic stage was pT3, pNx. No lymph node dissection was performed. The patient is recovering well from her recent surgery and she is here today for evaluation and discussion of her adjuvant treatment options. She is feeling fine today with no specific complaints. She denied having any diarrhea or hot flashes. She is regaining her from appetite. She denied having any significant weight loss or night sweats. She denied having any chest pain, shortness of breath, cough or hemoptysis. She has some mild constipation from narcotic treatment after her surgery. She has no nausea or  vomiting. Family history significant for mother who is healthy and father who had some cardiac issues after car accident. She had a paternal grandfather with stomach cancer. The patient is married and has 2 children age 41 and 53. She was accompanied today by her husband Melissa Roberson. She is currently a Engineer, petroleum at [employer]. she has a history of smoking less than one pack per day for around 7 years but quit 2 weeks ago. She drinks alcohol occasionally and no history of drug abuse. HPI  Past Medical History  Diagnosis Date  . Anxiety     Past Surgical History  Procedure Laterality Date  . Cesarean section    . Laparoscopic appendectomy N/A 06/29/2013    Procedure: APPENDECTOMY LAPAROSCOPIC;  Surgeon: Merrie Roof, MD;  Location: [facility] ORS;  Service: General;  Laterality: N/A;  . Appendectomy      History reviewed. No pertinent family history.  Social History History  Substance Use Topics  . Smoking status: Former Smoker    Types: Cigarettes    Quit date: 06/25/2013  . Smokeless tobacco: Never Used     Comment: Only smokes when drinks ETOH  . Alcohol Use: Yes     Comment: Socially    Allergies  Allergen Reactions  . Codeine Itching and Nausea Only    Current Outpatient Prescriptions  Medication Sig Dispense Refill  . acetaminophen (TYLENOL) 325 MG tablet Take 2 tablets (650 mg total) by mouth every 6 (six) hours as needed for mild pain, moderate pain, fever or headache (You can only take 4000 mg of Tylenol(acetaminophen) per day, this is in your prescribed pain medicine so you have to add that to any other you take.).      Marland Kitchen ibuprofen (ADVIL,MOTRIN) 800  MG tablet You can take one of these as directed, or you can get over the counter Ibuprofen, and use 2-3 tablets every 6 hours as needed.  30 tablet  0  . Multiple Vitamin (MULTIVITAMIN) capsule Take 1 capsule by mouth daily.      . polyethylene glycol (MIRALAX / GLYCOLAX) packet Take 17 g by mouth daily as needed.       No  current facility-administered medications for this visit.    Review of Systems  Constitutional: negative Eyes: negative Ears, nose, mouth, throat, and face: negative Respiratory: negative Cardiovascular: negative Gastrointestinal: negative Genitourinary:negative Integument/breast: negative Hematologic/lymphatic: negative Musculoskeletal:negative Neurological: negative Behavioral/Psych: negative Endocrine: negative Allergic/Immunologic: negative  Physical Exam  QIO:NGEXB, healthy, no distress, well nourished and well developed SKIN: skin color, texture, turgor are normal, no rashes or significant lesions HEAD: Normocephalic, No masses, lesions, tenderness or abnormalities EYES: normal, PERRLA EARS: External ears normal, Canals clear OROPHARYNX:no exudate, no erythema and lips, buccal mucosa, and tongue normal  NECK: supple, no adenopathy, no JVD LYMPH:  no palpable lymphadenopathy, no hepatosplenomegaly BREAST:not examined LUNGS: clear to auscultation , and palpation HEART: regular rate & rhythm, no murmurs and no gallops ABDOMEN:abdomen soft, non-tender, normal bowel sounds and no masses or organomegaly BACK: Back symmetric, no curvature., No CVA tenderness EXTREMITIES:no joint deformities, effusion, or inflammation, no edema, no skin discoloration  NEURO: alert & oriented x 3 with fluent speech, no focal motor/sensory deficits  PERFORMANCE STATUS: ECOG 0  LABORATORY DATA: Lab Results  Component Value Date   WBC 7.0 07/16/2013   HGB 13.4 07/16/2013   HCT 40.4 07/16/2013   MCV 91.2 07/16/2013   PLT 260 07/16/2013      Chemistry      Component Value Date/Time   NA 140 07/16/2013 1342   NA 143 06/29/2013 1227   K 4.1 07/16/2013 1342   K 3.6* 06/29/2013 1227   CL 109 06/29/2013 0518   CO2 24 07/16/2013 1342   CO2 24 06/29/2013 0510   BUN 11.1 07/16/2013 1342   BUN 10 06/29/2013 0518   CREATININE 0.7 07/16/2013 1342   CREATININE 0.70 06/29/2013 0518      Component Value  Date/Time   CALCIUM 9.4 07/16/2013 1342   CALCIUM 9.0 06/29/2013 0510   ALKPHOS 59 07/16/2013 1342   ALKPHOS 51 06/29/2013 0510   AST 15 07/16/2013 1342   AST 15 06/29/2013 0510   ALT 14 07/16/2013 1342   ALT 14 06/29/2013 0510   BILITOT 0.26 07/16/2013 1342   BILITOT 0.5 06/29/2013 0510       RADIOGRAPHIC STUDIES: Ct Abdomen Pelvis W Contrast  06/29/2013   CLINICAL DATA:  Abdominal pain with nausea and vomiting.  EXAM: CT ABDOMEN AND PELVIS WITH CONTRAST  TECHNIQUE: Multidetector CT imaging of the abdomen and pelvis was performed using the standard protocol following bolus administration of intravenous contrast.  CONTRAST:  40mL OMNIPAQUE IOHEXOL 300 MG/ML SOLN, 133mL OMNIPAQUE IOHEXOL 300 MG/ML SOLN  COMPARISON:  None.  FINDINGS: Included view of the lung bases are clear. Visualized heart and pericardium are unremarkable.  The liver, spleen, gallbladder, pancreas and adrenal glands are unremarkable.  The stomach, small and large bowel are normal in course and caliber without inflammatory changes. Enteric contrast has not yet reached the distal small bowel. The appendix is enlarged, 10 mm in transaxial dimension. Without superimposed periappendiceal inflammatory changes. Moderate amount of retained large bowel stool. No intraperitoneal free fluid nor free air.  Kidneys are orthotopic, demonstrating symmetric enhancement. Mild to moderate  right hydroureteronephrosis. No nephrolithiasis. No solid renal masses. The unopacified ureters are normal in course and caliber. Urinary bladder is partially distended and unremarkable.  Aortoiliac vessels are normal in course and caliber. No lymphadenopathy by CT size criteria. Anterior lower uterine segment Caesarean section scar. The soft tissues and included osseous structures are nonsuspicious.  IMPRESSION: Enlarged appendix without superimposed inflammatory changes, equivocal for early acute appendicitis.  Mild to moderate right hydro nephrosis without urolithiasis.    Electronically Signed   By: Elon Alas   On: 06/29/2013 05:51    ASSESSMENT: This is a very pleasant 28 years old white female with recently diagnosed with a stage II (T3, NX, MX) carcinoid tumor of the appendix that was incidentally found during her laparoscopic appendectomy. Her margins of resection were free of tumor and no lymph node dissection was performed. 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   PLAN: I had a lengthy discussion with the patient and her husband today about her current disease is stage, prognosis and treatment options. The patient is currently asymptomatic and has no symptoms related to active carcinoid tumor. She underwent definitive surgery by resection of the appendix. I will complete her staging workup I ordered a CT scan of the chest to rule out the presence of any carcinoid tumor of the chest. Her CT scan of the abdomen showed no evidence for metastatic carcinoid tumor in the abdomen. I recommended for the patient to continue on observation with repeat CT scan of the abdomen in addition to blood work in 6 months. She was advised to call me immediately if she has any concerning symptoms in the interval. I gave the patient and her husband the time to ask questions and I answered them completely to their satisfaction.  The patient voices understanding of current disease status and treatment options and is in agreement with the current care plan.  All questions were answered. The patient knows to call the clinic with any problems, questions or concerns. We can certainly see the patient much sooner if necessary.  Thank you so much for allowing me to participate in the care of [place]. I will continue to follow up the patient with you and assist in her care.  I spent 30 minutes counseling the patient face to face. The total time spent in the appointment was 55 minutes.  Disclaimer: This note was dictated with voice recognition software.  Similar sounding words can inadvertently be transcribed and may not be corrected upon review.   Curt Bears 07/16/2013, 5:12 PM

## 2013-07-16 NOTE — Progress Notes (Signed)
Checked in new pt with no financial concerns. °

## 2013-07-16 NOTE — Telephone Encounter (Signed)
gv and printed appt sched and avs for pt for NOV...gv pt barium  °

## 2013-07-18 ENCOUNTER — Ambulatory Visit (HOSPITAL_COMMUNITY)
Admission: RE | Admit: 2013-07-18 | Discharge: 2013-07-18 | Disposition: A | Discharge status: Home / Self Care | Payer: BC Managed Care – PPO | Source: Ambulatory Visit | Attending: Internal Medicine | Admitting: Internal Medicine

## 2013-07-18 DIAGNOSIS — Z8509 Personal history of malignant neoplasm of other digestive organs: Secondary | ICD-10-CM | POA: Insufficient documentation

## 2013-07-18 DIAGNOSIS — C7A00 Malignant carcinoid tumor of unspecified site: Secondary | ICD-10-CM

## 2013-07-18 IMAGING — CT CT CHEST W/ CM
2 of 3 series · 15 of 36 positions shown, 18 images · IV contrast (OMNIPAQUE)
Comparison: None.

CLINICAL DATA: Appendiceal carcinoid.  Evaluate for distant YUKARI.

EXAM:
CT CHEST WITH CONTRAST
TECHNIQUE: Multidetector CT imaging of the chest was performed during
intravenous contrast administration.
CONTRAST:  80mL OMNIPAQUE IOHEXOL 300 MG/ML  SOLN

[Series 2: chest with st · axial · 0.74mm/px · z∈[-28,+227]mm · 12 of 61 slices shown, 15 images]
[im 5/61  mediastinal]
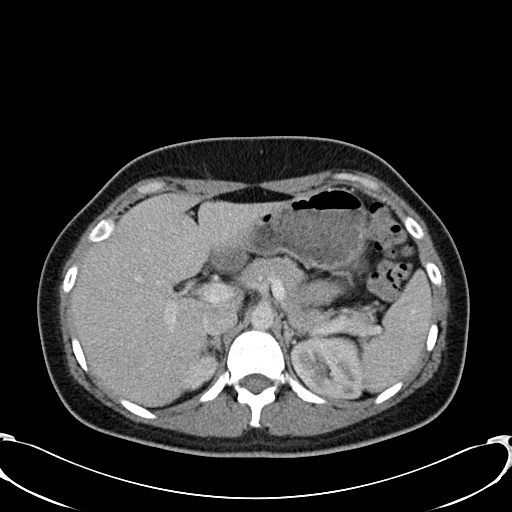
[im 5/61  lung]
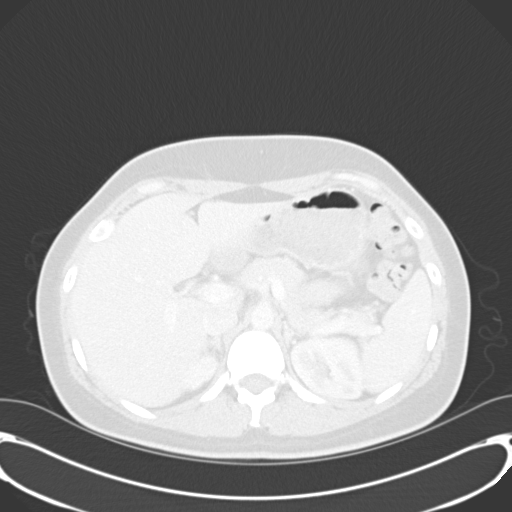
[im 9/61  lung]
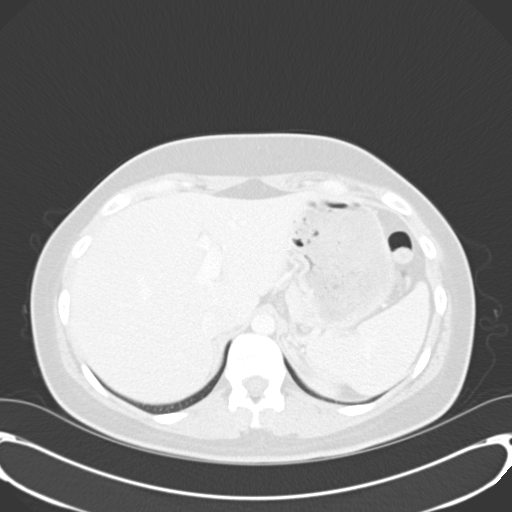
[im 14/61  lung]
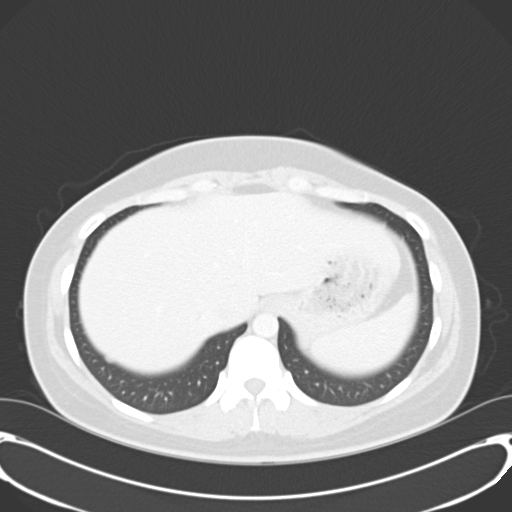
[im 18/61  lung]
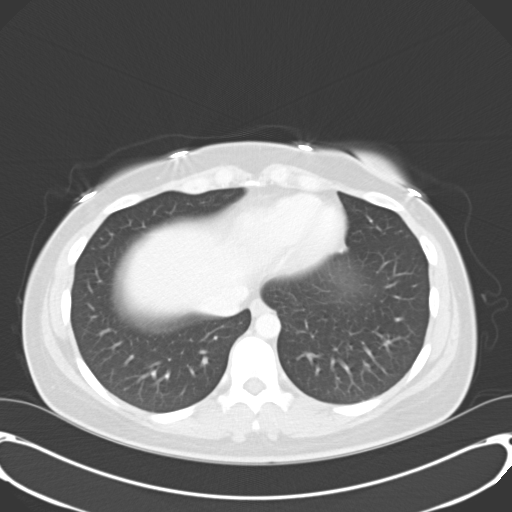
[im 23/61  mediastinal]
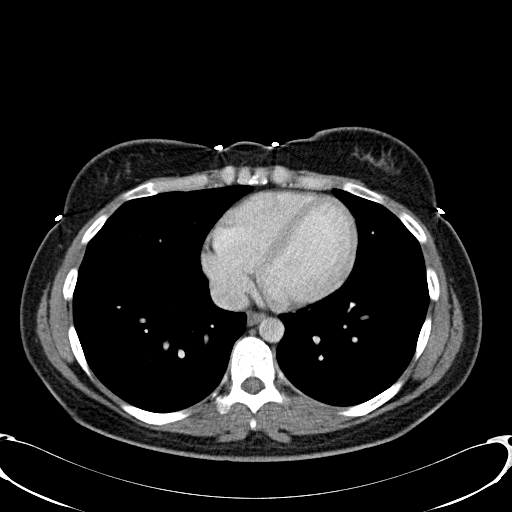
[im 23/61  lung]
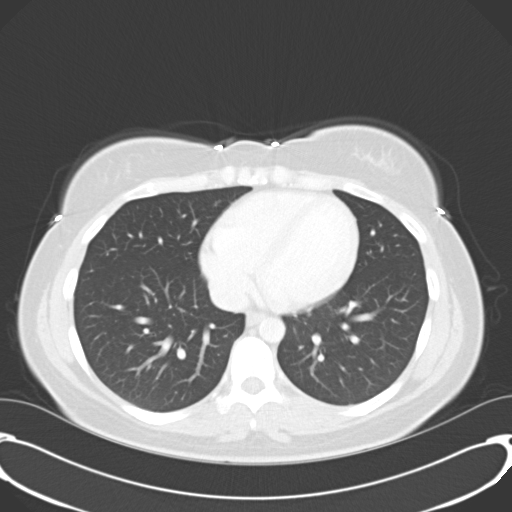
[im 27/61  lung]
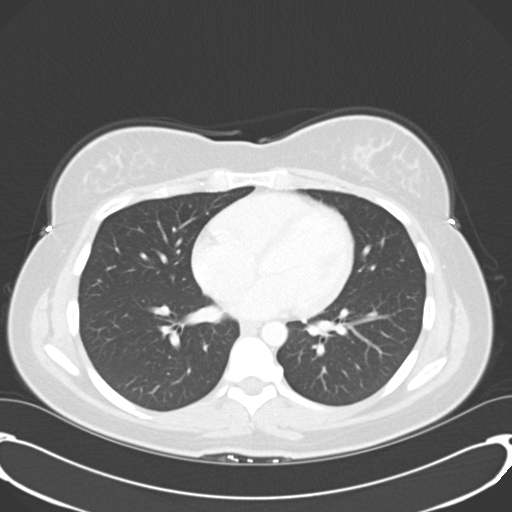
[im 34/61  lung]
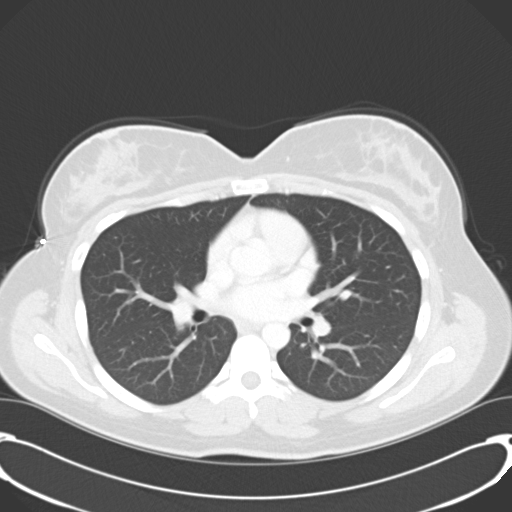
[im 38/61  lung]
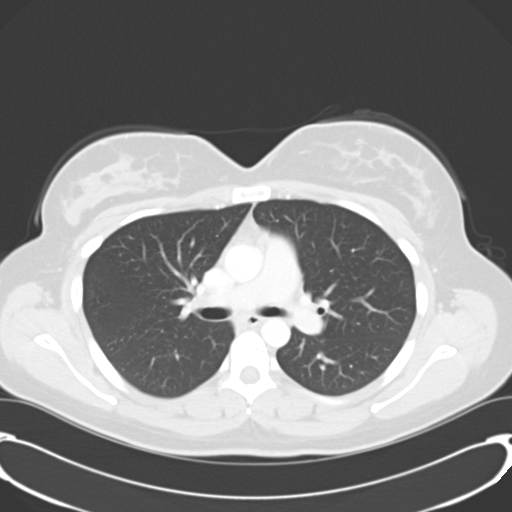
[im 43/61  mediastinal]
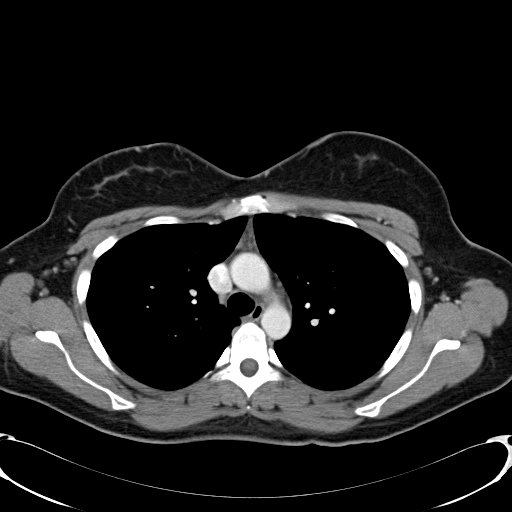
[im 43/61  lung]
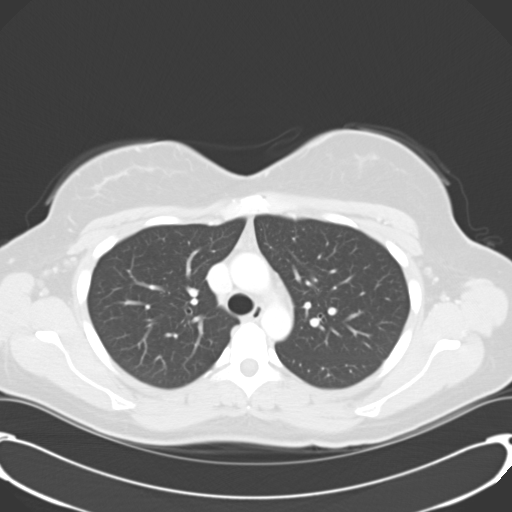
[im 47/61  lung]
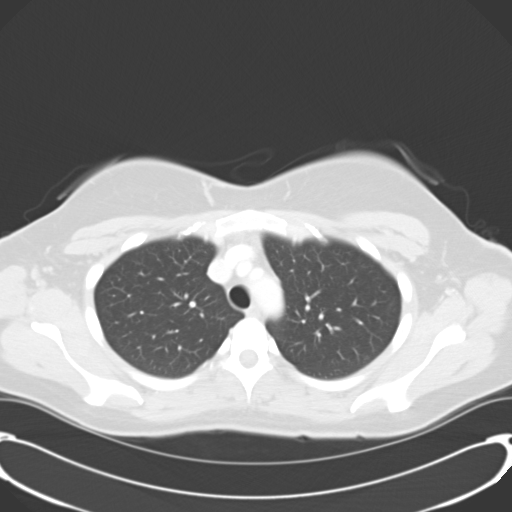
[im 52/61  lung]
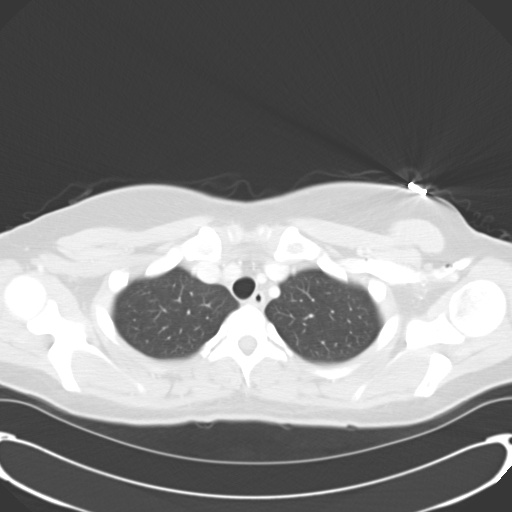
[im 56/61  lung]
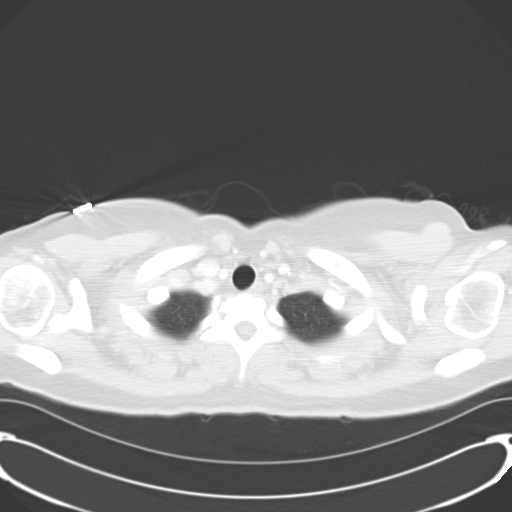

[Series 602: <mpr thick range> · coronal · 0.74mm/px · 3 of 103 slices shown]
[im 21/103  lung]
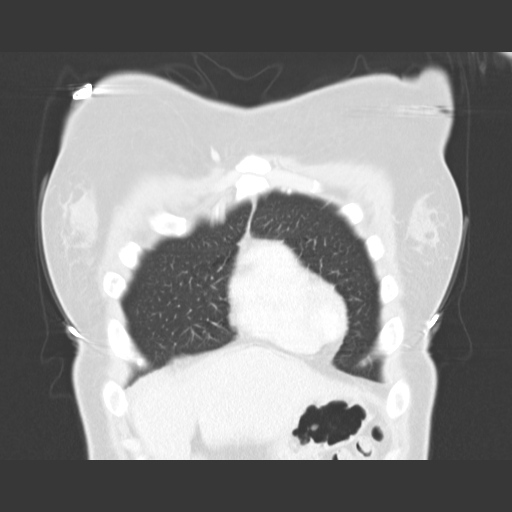
[im 41/103  lung]
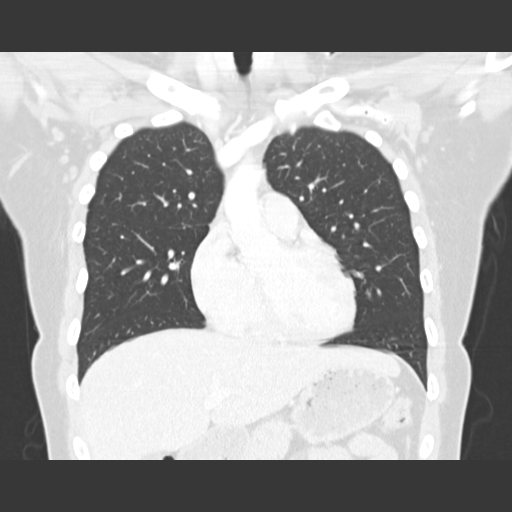
[im 62/103  lung]
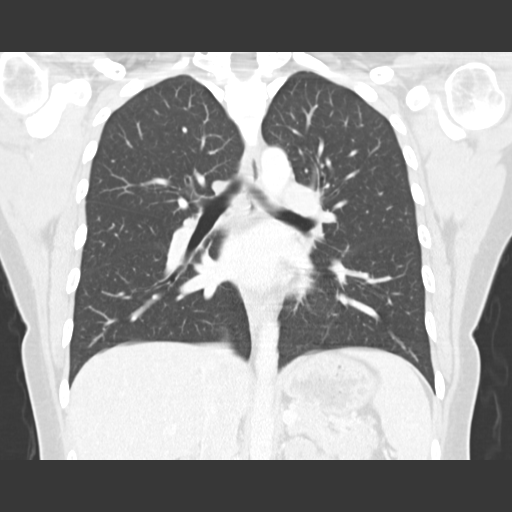

[15 of 36 positions shown; findings below may reference images not displayed]

FINDINGS: Soft tissue / Mediastinum: There is no axillary lymphadenopathy.
Subtle soft tissue attenuation in the anterior mediastinum is
compatible with thymic remnant. There is no mediastinal
lymphadenopathy. No hilar lymphadenopathy. The heart size is normal.
No pericardial effusion.

Lungs / Pleura: Lungs are clear. Specifically, no pulmonary
parenchymal nodule or mass. No focal airspace consolidation or
pulmonary edema. No evidence for pleural effusion.

Bones: Bone windows reveal no worrisome lytic or sclerotic osseous
lesions.

Upper Abdomen:  Unremarkable.
IMPRESSION: No evidence for metastatic disease in the chest.

## 2013-07-18 MED ORDER — IOHEXOL 300 MG/ML  SOLN
80.0000 mL | Freq: Once | INTRAMUSCULAR | Status: AC | PRN
  Administered 2013-07-18: 80 mL via INTRAVENOUS

## 2013-07-24 ENCOUNTER — Telehealth: Payer: Self-pay | Admitting: Medical Oncology

## 2013-07-25 NOTE — Telephone Encounter (Signed)
.  pt notified of scan result.

## 2013-08-12 ENCOUNTER — Encounter (INDEPENDENT_AMBULATORY_CARE_PROVIDER_SITE_OTHER): Payer: BC Managed Care – PPO | Admitting: General Surgery

## 2013-11-20 ENCOUNTER — Telehealth: Payer: Self-pay | Admitting: Internal Medicine

## 2013-11-20 NOTE — Telephone Encounter (Signed)
S/w pt to advise appt time chg on 12/1 from  3.30 to 9.45am due to md on call. However md has switched call days with GM and still has pt's originally scheduled appt time avail. Advised pt I've moved her 9.45 appt time back to 3.30 where it was originally. Pt verbalized understanding.

## 2013-12-23 ENCOUNTER — Telehealth: Payer: Self-pay | Admitting: Internal Medicine

## 2013-12-23 NOTE — Telephone Encounter (Signed)
Called pt to advised of appt chg from 12/1 (md pal) to 12/2. Pt's vm has not been set up. Unable to LVM. Mailed revised appt calendar appt 12/2 @ 3.30pm.

## 2014-01-13 ENCOUNTER — Encounter (HOSPITAL_COMMUNITY): Payer: Self-pay

## 2014-01-13 ENCOUNTER — Other Ambulatory Visit (HOSPITAL_BASED_OUTPATIENT_CLINIC_OR_DEPARTMENT_OTHER): Payer: BC Managed Care – PPO

## 2014-01-13 ENCOUNTER — Ambulatory Visit (HOSPITAL_COMMUNITY)
Admission: RE | Admit: 2014-01-13 | Discharge: 2014-01-13 | Disposition: A | Discharge status: Home / Self Care | Payer: BC Managed Care – PPO | Source: Ambulatory Visit | Attending: Internal Medicine | Admitting: Internal Medicine

## 2014-01-13 ENCOUNTER — Other Ambulatory Visit: Payer: BC Managed Care – PPO

## 2014-01-13 DIAGNOSIS — C7A00 Malignant carcinoid tumor of unspecified site: Secondary | ICD-10-CM

## 2014-01-13 DIAGNOSIS — C7A020 Malignant carcinoid tumor of the appendix: Secondary | ICD-10-CM | POA: Insufficient documentation

## 2014-01-13 DIAGNOSIS — Z9049 Acquired absence of other specified parts of digestive tract: Secondary | ICD-10-CM | POA: Insufficient documentation

## 2014-01-13 LAB — COMPREHENSIVE METABOLIC PANEL (CC13)
ALBUMIN: 4.6 g/dL (ref 3.5–5.0)
ALT: 23 U/L (ref 0–55)
ANION GAP: 9 meq/L (ref 3–11)
AST: 18 U/L (ref 5–34)
Alkaline Phosphatase: 70 U/L (ref 40–150)
BILIRUBIN TOTAL: 0.45 mg/dL (ref 0.20–1.20)
BUN: 11.1 mg/dL (ref 7.0–26.0)
CO2: 27 meq/L (ref 22–29)
Calcium: 10 mg/dL (ref 8.4–10.4)
Chloride: 104 mEq/L (ref 98–109)
Creatinine: 0.7 mg/dL (ref 0.6–1.1)
GLUCOSE: 84 mg/dL (ref 70–140)
POTASSIUM: 4.3 meq/L (ref 3.5–5.1)
Sodium: 140 mEq/L (ref 136–145)
TOTAL PROTEIN: 8 g/dL (ref 6.4–8.3)

## 2014-01-13 LAB — CBC WITH DIFFERENTIAL/PLATELET
BASO%: 0.2 % (ref 0.0–2.0)
Basophils Absolute: 0 10*3/uL (ref 0.0–0.1)
EOS%: 5.3 % (ref 0.0–7.0)
Eosinophils Absolute: 0.2 10*3/uL (ref 0.0–0.5)
HCT: 42.7 % (ref 34.8–46.6)
HGB: 14.2 g/dL (ref 11.6–15.9)
LYMPH#: 2 10*3/uL (ref 0.9–3.3)
LYMPH%: 44.9 % (ref 14.0–49.7)
MCH: 30.4 pg (ref 25.1–34.0)
MCHC: 33.3 g/dL (ref 31.5–36.0)
MCV: 91.4 fL (ref 79.5–101.0)
MONO#: 0.3 10*3/uL (ref 0.1–0.9)
MONO%: 7.3 % (ref 0.0–14.0)
NEUT#: 1.9 10*3/uL (ref 1.5–6.5)
NEUT%: 42.3 % (ref 38.4–76.8)
Platelets: 255 10*3/uL (ref 145–400)
RBC: 4.67 10*6/uL (ref 3.70–5.45)
RDW: 12.5 % (ref 11.2–14.5)
WBC: 4.5 10*3/uL (ref 3.9–10.3)

## 2014-01-13 LAB — LACTATE DEHYDROGENASE (CC13): LDH: 169 U/L (ref 125–245)

## 2014-01-13 IMAGING — CT CT ABD-PELV W/ CM
2 of 3 series · 17 of 46 positions shown, 19 images · IV contrast (OMNIPAQUE)
Comparison: Prior exam of [DATE] and the clinic note of
[DATE].

CLINICAL DATA: Status post appendectomy for low-grade malignant
carcinoid tumor of the appendix diagnosed [DATE].

EXAM:
CT ABDOMEN AND PELVIS WITH CONTRAST
TECHNIQUE: Multidetector CT imaging of the abdomen and pelvis was performed
using the standard protocol following bolus administration of
intravenous contrast.
CONTRAST:  100mL OMNIPAQUE IOHEXOL 300 MG/ML  SOLN

[Series 2: rtn a/p with · axial · 0.74mm/px · z∈[-620,-230]mm · 14 of 90 slices shown, 16 images]
[im 6/90  soft-tissue]
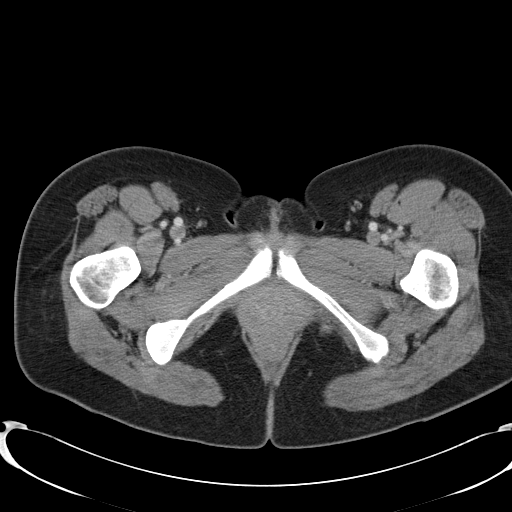
[im 6/90  bone]
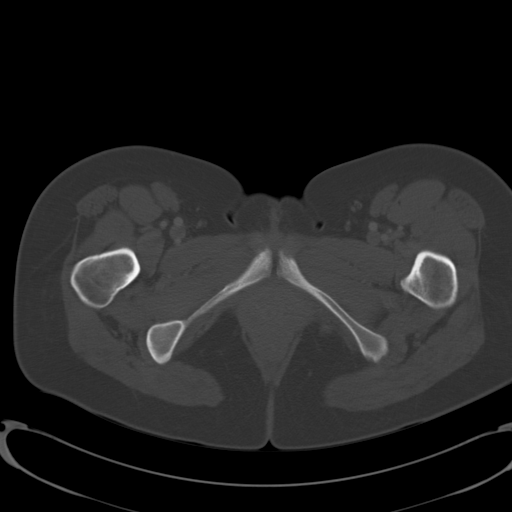
[im 12/90  soft-tissue]
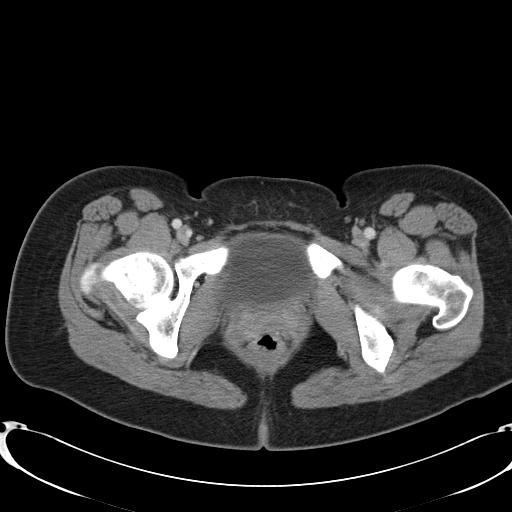
[im 18/90  soft-tissue]
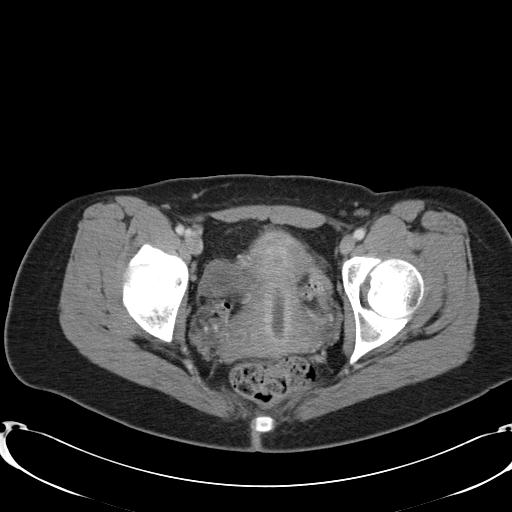
[im 23/90  soft-tissue]
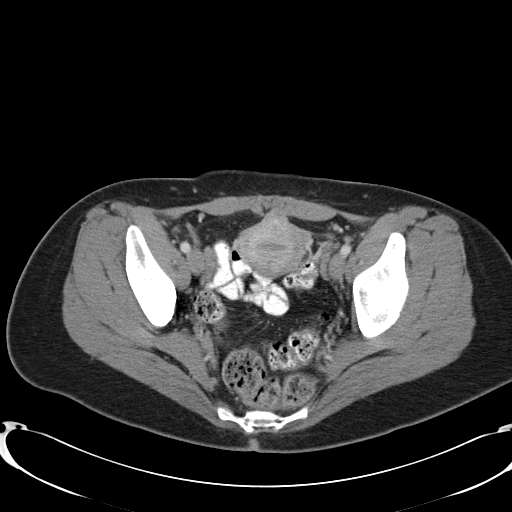
[im 29/90  soft-tissue]
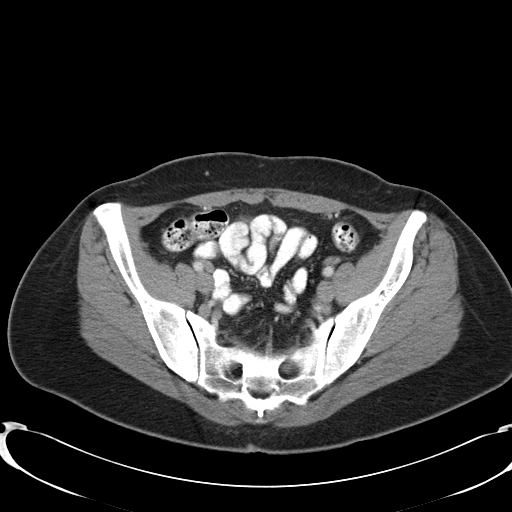
[im 35/90  soft-tissue]
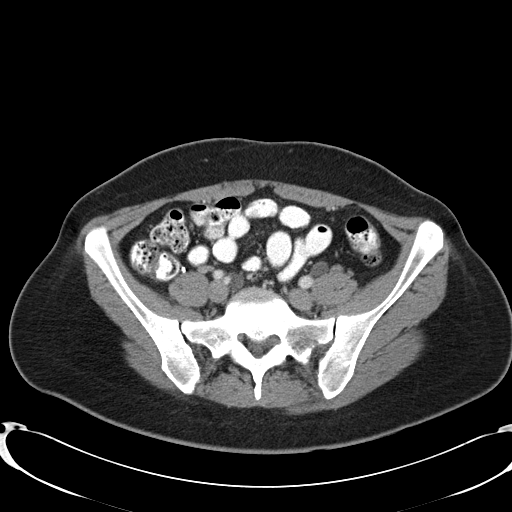
[im 41/90  soft-tissue]
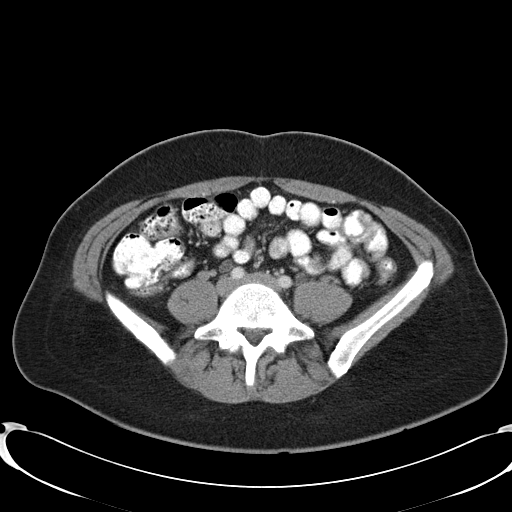
[im 49/90  soft-tissue]
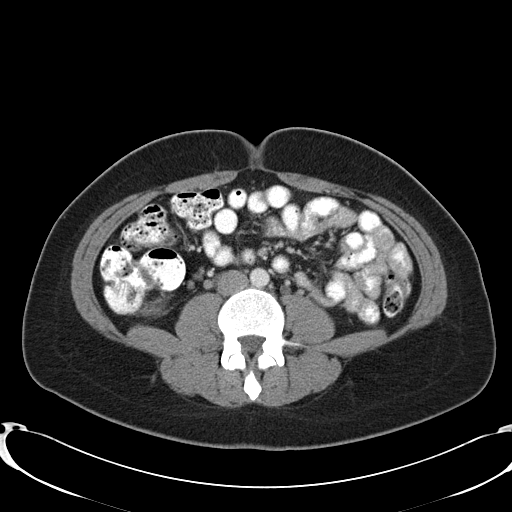
[im 55/90  soft-tissue]
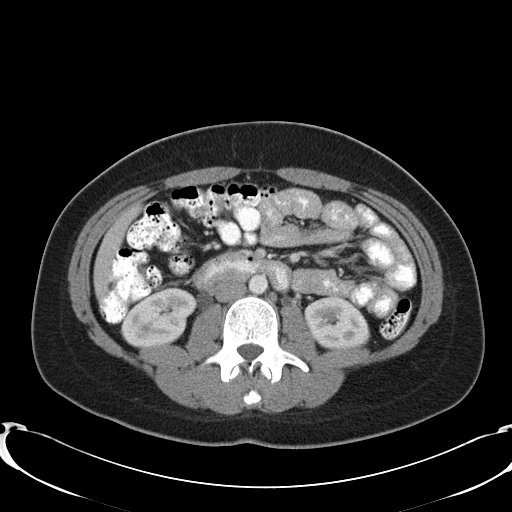
[im 55/90  bone]
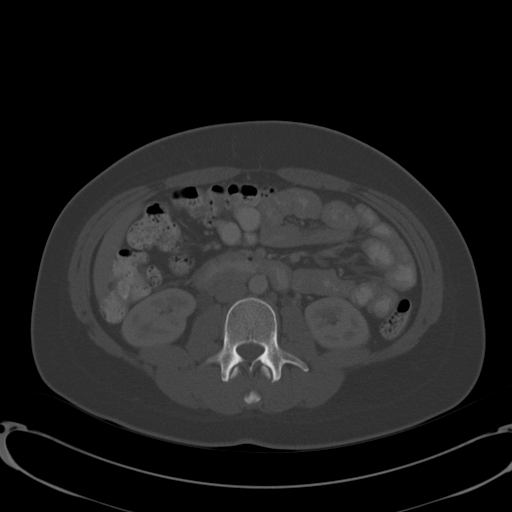
[im 61/90  soft-tissue]
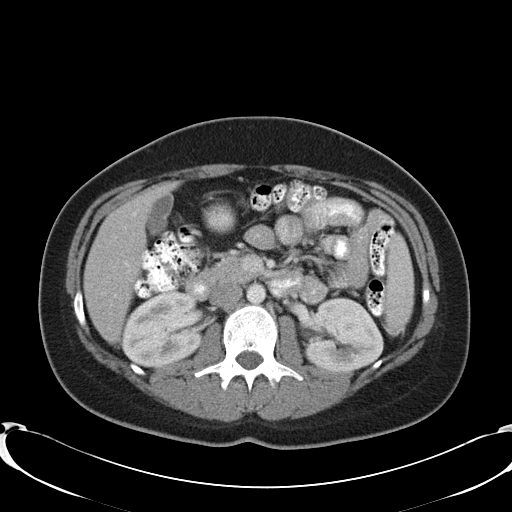
[im 67/90  soft-tissue]
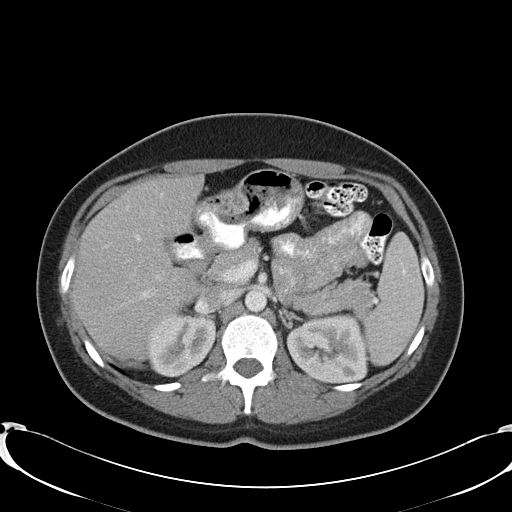
[im 72/90  soft-tissue]
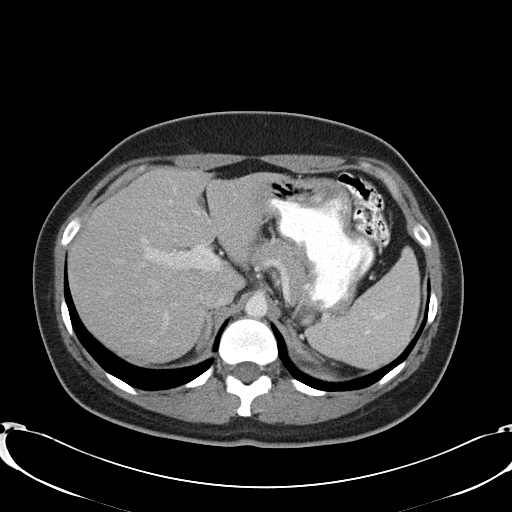
[im 78/90  soft-tissue]
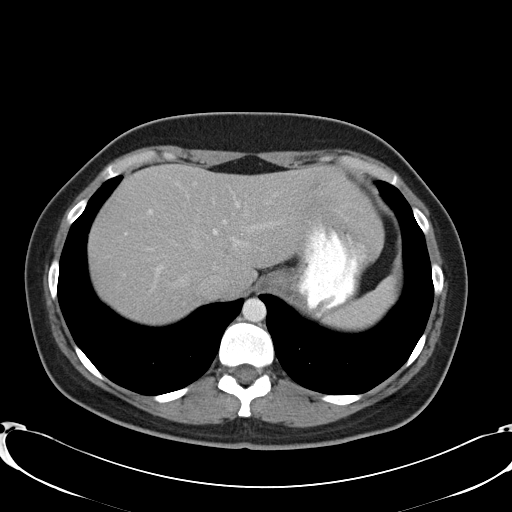
[im 84/90  soft-tissue]
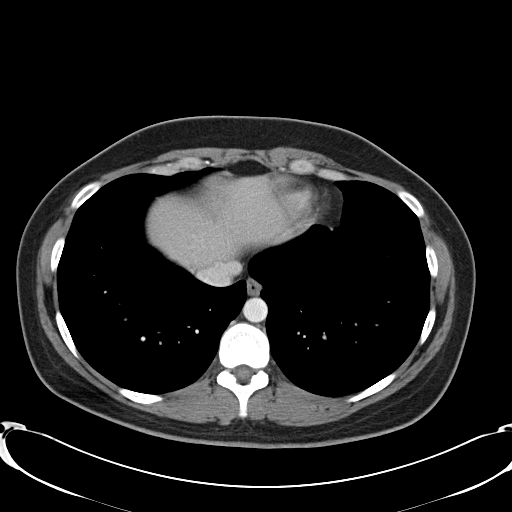

[Series 602: <mpr thick range> · coronal · 0.87mm/px · 3 of 87 slices shown]
[im 29/87  soft-tissue]
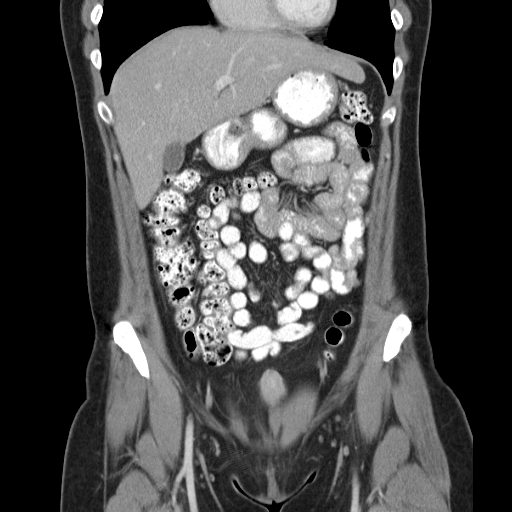
[im 39/87  soft-tissue]
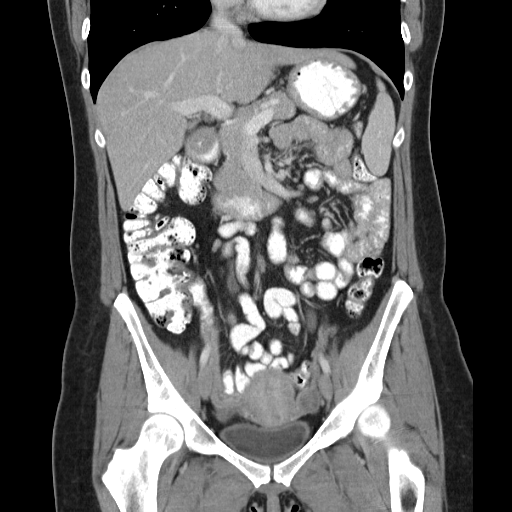
[im 48/87  soft-tissue]
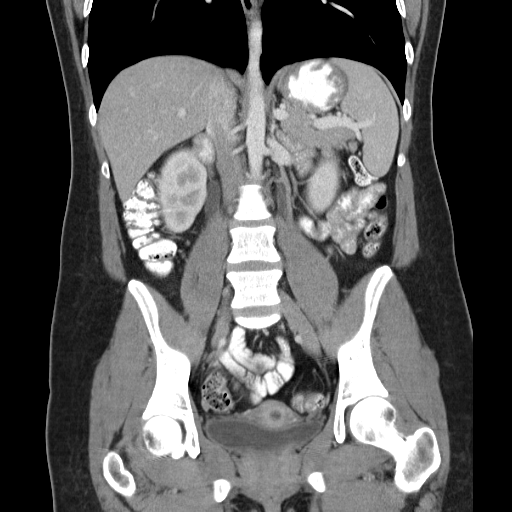

[17 of 46 positions shown; findings below may reference images not displayed]

FINDINGS: Lower chest: Clear lung bases. Normal heart size, without
pericardial or pleural effusion.

Hepatobiliary: Normal liver and gallbladder, without biliary ductal
dilatation.

Pancreas: Normal, without mass or pancreatic ductal dilatation.

Spleen: Normal

Adrenals/Urinary Tract: Normal adrenal glands. Normal kidneys,
without hydronephrosis or hydroureter. Normal bladder.

Stomach/Bowel: Normal stomach, without wall thickening. Normal colon
and terminal ileum. Status post appendectomy, without locally
recurrent disease. Normal small bowel without abdominal ascites.

Vascular/Lymphatic: Normal caliber of the aorta. No retroperitoneal
or retrocrural adenopathy. No pelvic sidewall adenopathy. No
mesenteric adenopathy. No mesenteric mass or calcification to
suggest carcinoid recurrence.

Reproductive: Normal uterus and adnexa, without significant free
pelvic fluid.

Other: No abdominal pelvic fluid. No evidence of omental or
peritoneal disease.

Musculoskeletal: No focal osseous abnormality.
IMPRESSION: 1. Status post appendectomy, without locally recurrent or metastatic
disease.
2. Slightly decreased sensitivity for hepatic metastasis, given
absence of arterial phase imaging. On follow-up exams, arterial
phase imaging should be considered (as carcinoid metastasis are
typically hypervascular). Alternatively, if there is a clinical
concern of hepatic metastasis, pre and post contrast abdominal MRI
should be considered (given patient age and radiation concerns).

## 2014-01-13 MED ORDER — IOHEXOL 300 MG/ML  SOLN
100.0000 mL | Freq: Once | INTRAMUSCULAR | Status: AC | PRN
  Administered 2014-01-13: 100 mL via INTRAVENOUS

## 2014-01-19 ENCOUNTER — Telehealth: Payer: Self-pay | Admitting: Internal Medicine

## 2014-01-19 LAB — CHROMOGRANIN A: Chromogranin A: 6 ng/mL (ref <=15)

## 2014-01-19 NOTE — Telephone Encounter (Signed)
pt called to r/s appt..done...pt aware of new d.t °

## 2014-01-20 ENCOUNTER — Telehealth: Payer: Self-pay | Admitting: Internal Medicine

## 2014-01-20 ENCOUNTER — Ambulatory Visit (HOSPITAL_BASED_OUTPATIENT_CLINIC_OR_DEPARTMENT_OTHER): Payer: BC Managed Care – PPO | Admitting: Internal Medicine

## 2014-01-20 ENCOUNTER — Encounter: Payer: Self-pay | Admitting: Internal Medicine

## 2014-01-20 ENCOUNTER — Ambulatory Visit: Payer: BC Managed Care – PPO | Admitting: Internal Medicine

## 2014-01-20 VITALS — BP 131/86 | HR 96 | Temp 98.5°F | Resp 18 | Ht 67.0 in | Wt 177.0 lb

## 2014-01-20 DIAGNOSIS — C7A020 Malignant carcinoid tumor of the appendix: Secondary | ICD-10-CM

## 2014-01-20 DIAGNOSIS — C7A00 Malignant carcinoid tumor of unspecified site: Secondary | ICD-10-CM

## 2014-01-20 NOTE — Telephone Encounter (Signed)
gv adn printed appt sched and avs for pt for may 2016

## 2014-01-20 NOTE — Progress Notes (Signed)
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  PRIOR THERAPY: Status post laparoscopic appendectomy under the care of Dr. Marlou Starks.  CURRENT THERAPY: Observation.  INTERVAL HISTORY: Melissa Roberson 28 y.o. female returns to the clinic today for six-month follow-up visit. The patient is feeling fine today was no specific complaints except for occasional nausea. She denied having any significant hot flashes or diarrhea. She denied having any abdominal pain. The patient has no chest pain, shortness of breath, cough or hemoptysis. She had repeat CT scan of the abdomen and pelvis as well as blood work performed recently and she is here for evaluation and discussion of her scan results.  MEDICAL HISTORY: Past Medical History  Diagnosis Date  . Anxiety     ALLERGIES:  is allergic to codeine.  MEDICATIONS:  Current Outpatient Prescriptions  Medication Sig Dispense Refill  . Lysine 1000 MG TABS Take 1,000 mg by mouth daily.    . Multiple Vitamin (MULTIVITAMIN) capsule Take 1 capsule by mouth daily.    Marland Kitchen acetaminophen (TYLENOL) 325 MG tablet Take 2 tablets (650 mg total) by mouth every 6 (six) hours as needed for mild pain, moderate pain, fever or headache (You can only take 4000 mg of Tylenol(acetaminophen) per day, this is in your prescribed pain medicine so you have to add that to any other you take.). (Patient not taking: Reported on 01/20/2014)    . ibuprofen (ADVIL,MOTRIN) 800 MG tablet You can take one of these as directed, or you can get over the counter Ibuprofen, and use 2-3 tablets every 6 hours as needed. (Patient not taking: Reported on 01/20/2014) 30 tablet 0   No current facility-administered medications for this visit.    SURGICAL HISTORY:  Past Surgical  History  Procedure Laterality Date  . Cesarean section    . Laparoscopic appendectomy N/A 06/29/2013    Procedure: APPENDECTOMY LAPAROSCOPIC;  Surgeon: Merrie Roof, MD;  Location: [facility];  Service: General;  Laterality: N/A;  . Appendectomy      REVIEW OF SYSTEMS:  A comprehensive review of systems was negative.   PHYSICAL EXAMINATION: General appearance: alert, cooperative and no distress Head: Normocephalic, without obvious abnormality, atraumatic Neck: no adenopathy, no JVD, supple, symmetrical, trachea midline and thyroid not enlarged, symmetric, no tenderness/mass/nodules Lymph nodes: Cervical, supraclavicular, and axillary nodes normal. Resp: clear to auscultation bilaterally Back: symmetric, no curvature. ROM normal. No CVA tenderness. Cardio: regular rate and rhythm, S1, S2 normal, no murmur, click, rub or gallop GI: soft, non-tender; bowel sounds normal; no masses,  no organomegaly Extremities: extremities normal, atraumatic, no cyanosis or edema  ECOG PERFORMANCE STATUS: 1 - Symptomatic but completely ambulatory  Blood pressure 131/86, pulse 96, temperature 98.5 F (36.9 C), temperature source Oral, resp. rate 18, height 5\' 7"  (1.702 m), weight 177 lb (80.287 kg), last menstrual period 12/30/2013.  LABORATORY DATA: Lab Results  Component Value Date   WBC 4.5 01/13/2014   HGB 14.2 01/13/2014   HCT 42.7 01/13/2014   MCV 91.4 01/13/2014   PLT 255 01/13/2014      Chemistry      Component Value Date/Time   NA 140 01/13/2014 0957   NA 143 06/29/2013 1227   K 4.3 01/13/2014 0957   K 3.6* 06/29/2013 1227   CL  109 06/29/2013 0518   CO2 27 01/13/2014 0957   CO2 24 06/29/2013 0510   BUN 11.1 01/13/2014 0957   BUN 10 06/29/2013 0518   CREATININE 0.7 01/13/2014 0957   CREATININE 0.70 06/29/2013 0518      Component Value Date/Time   CALCIUM 10.0 01/13/2014 0957   CALCIUM 9.0 06/29/2013 0510   ALKPHOS 70 01/13/2014 0957   ALKPHOS 51 06/29/2013 0510   AST 18  01/13/2014 0957   AST 15 06/29/2013 0510   ALT 23 01/13/2014 0957   ALT 14 06/29/2013 0510   BILITOT 0.45 01/13/2014 0957   BILITOT 0.5 06/29/2013 0510       RADIOGRAPHIC STUDIES: Ct Abdomen Pelvis W Contrast  01/13/2014   CLINICAL DATA:  Status post appendectomy for low-grade malignant carcinoid tumor of the appendix diagnosed 5/15.  EXAM: CT ABDOMEN AND PELVIS WITH CONTRAST  TECHNIQUE: Multidetector CT imaging of the abdomen and pelvis was performed using the standard protocol following bolus administration of intravenous contrast.  CONTRAST:  175mL OMNIPAQUE IOHEXOL 300 MG/ML  SOLN  COMPARISON:  Prior exam of 06/29/2013 and the clinic note of 07/16/2013.  FINDINGS: Lower chest: Clear lung bases. Normal heart size, without pericardial or pleural effusion.  Hepatobiliary: Normal liver and gallbladder, without biliary ductal dilatation.  Pancreas: Normal, without mass or pancreatic ductal dilatation.  Spleen: Normal  Adrenals/Urinary Tract: Normal adrenal glands. Normal kidneys, without hydronephrosis or hydroureter. Normal bladder.  Stomach/Bowel: Normal stomach, without wall thickening. Normal colon and terminal ileum. Status post appendectomy, without locally recurrent disease. Normal small bowel without abdominal ascites.  Vascular/Lymphatic: Normal caliber of the aorta. No retroperitoneal or retrocrural adenopathy. No pelvic sidewall adenopathy. No mesenteric adenopathy. No mesenteric mass or calcification to suggest carcinoid recurrence.  Reproductive: Normal uterus and adnexa, without significant free pelvic fluid.  Other: No abdominal pelvic fluid. No evidence of omental or peritoneal disease.  Musculoskeletal: No focal osseous abnormality.  IMPRESSION: 1. Status post appendectomy, without locally recurrent or metastatic disease. 2. Slightly decreased sensitivity for hepatic metastasis, given absence of arterial phase imaging. On follow-up exams, arterial phase imaging should be considered (as  carcinoid metastasis are typically hypervascular). Alternatively, if there is a clinical concern of hepatic metastasis, pre and post contrast abdominal MRI should be considered (given patient age and radiation concerns).   Electronically Signed   By: Abigail Miyamoto M.D.   On: 01/13/2014 12:21    ASSESSMENT AND PLAN: This is a very pleasant 28 years old white female with history of stage II appendiceal carcinoid tumor status post appendectomy. The patient is feeling fine and the recent imaging studies showed no evidence for disease recurrence. Her blood work including chromogranin A is also within the normal limit. I discussed the scan and lab result with the patient today. I recommended for her to continue on observation. I will see her back for follow-up visit in 6 months with repeat blood work, including CBC, comprehensive metabolic panel, LDH and chromogranin A. She was advised to call immediately if she has any concerning symptoms in the interval. The patient voices understanding of current disease status and treatment options and is in agreement with the current care plan.  All questions were answered. The patient knows to call the clinic with any problems, questions or concerns. We can certainly see the patient much sooner if necessary.  Disclaimer: This note was dictated with voice recognition software. Similar sounding words can inadvertently be transcribed and may not be corrected upon review.

## 2014-01-21 ENCOUNTER — Ambulatory Visit: Payer: BC Managed Care – PPO | Admitting: Internal Medicine

## 2014-05-07 ENCOUNTER — Telehealth: Payer: Self-pay | Admitting: Internal Medicine

## 2014-05-07 NOTE — Telephone Encounter (Signed)
spoke with patient and resched appt per MD on pal....pt ok and aware °

## 2014-06-30 ENCOUNTER — Other Ambulatory Visit (HOSPITAL_BASED_OUTPATIENT_CLINIC_OR_DEPARTMENT_OTHER): Payer: BLUE CROSS/BLUE SHIELD

## 2014-06-30 DIAGNOSIS — C7A00 Malignant carcinoid tumor of unspecified site: Secondary | ICD-10-CM

## 2014-06-30 LAB — CBC WITH DIFFERENTIAL/PLATELET
BASO%: 0.4 % (ref 0.0–2.0)
Basophils Absolute: 0 10*3/uL (ref 0.0–0.1)
EOS ABS: 0.3 10*3/uL (ref 0.0–0.5)
EOS%: 6.7 % (ref 0.0–7.0)
HCT: 42.8 % (ref 34.8–46.6)
HGB: 14.3 g/dL (ref 11.6–15.9)
LYMPH#: 1.4 10*3/uL (ref 0.9–3.3)
LYMPH%: 34.6 % (ref 14.0–49.7)
MCH: 30.6 pg (ref 25.1–34.0)
MCHC: 33.3 g/dL (ref 31.5–36.0)
MCV: 91.7 fL (ref 79.5–101.0)
MONO#: 0.3 10*3/uL (ref 0.1–0.9)
MONO%: 7.4 % (ref 0.0–14.0)
NEUT%: 50.9 % (ref 38.4–76.8)
NEUTROS ABS: 2.1 10*3/uL (ref 1.5–6.5)
Platelets: 230 10*3/uL (ref 145–400)
RBC: 4.67 10*6/uL (ref 3.70–5.45)
RDW: 12.8 % (ref 11.2–14.5)
WBC: 4 10*3/uL (ref 3.9–10.3)

## 2014-06-30 LAB — COMPREHENSIVE METABOLIC PANEL (CC13)
ALBUMIN: 4.4 g/dL (ref 3.5–5.0)
ALT: 18 U/L (ref 0–55)
ANION GAP: 11 meq/L (ref 3–11)
AST: 17 U/L (ref 5–34)
Alkaline Phosphatase: 51 U/L (ref 40–150)
BUN: 10.1 mg/dL (ref 7.0–26.0)
CALCIUM: 8.9 mg/dL (ref 8.4–10.4)
CO2: 24 meq/L (ref 22–29)
CREATININE: 0.8 mg/dL (ref 0.6–1.1)
Chloride: 105 mEq/L (ref 98–109)
EGFR: >90 mL/min/{1.73_m2} (ref >90)
Glucose: 86 mg/dl (ref 70–140)
POTASSIUM: 4.1 meq/L (ref 3.5–5.1)
Sodium: 140 mEq/L (ref 136–145)
Total Bilirubin: 0.57 mg/dL (ref 0.20–1.20)
Total Protein: 7.3 g/dL (ref 6.4–8.3)

## 2014-06-30 LAB — LACTATE DEHYDROGENASE (CC13): LDH: 154 U/L (ref 125–245)

## 2014-07-02 ENCOUNTER — Encounter: Payer: Self-pay | Admitting: Internal Medicine

## 2014-07-02 ENCOUNTER — Ambulatory Visit (HOSPITAL_BASED_OUTPATIENT_CLINIC_OR_DEPARTMENT_OTHER): Payer: BLUE CROSS/BLUE SHIELD | Admitting: Internal Medicine

## 2014-07-02 ENCOUNTER — Telehealth: Payer: Self-pay | Admitting: Internal Medicine

## 2014-07-02 VITALS — BP 129/76 | HR 71 | Temp 98.9°F | Resp 18 | Ht 67.0 in | Wt 169.7 lb

## 2014-07-02 DIAGNOSIS — C7A020 Malignant carcinoid tumor of the appendix: Secondary | ICD-10-CM

## 2014-07-02 DIAGNOSIS — C7A00 Malignant carcinoid tumor of unspecified site: Secondary | ICD-10-CM

## 2014-07-02 NOTE — Telephone Encounter (Signed)
Pt confirmed labs/ov per 05/12 POF, gave pt AVS and Calendar.... KJ, gave pt barium

## 2014-07-02 NOTE — Progress Notes (Signed)
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  PRIOR THERAPY: Status post laparoscopic appendectomy under the care of Dr. Marlou Starks.  CURRENT THERAPY: Observation.  INTERVAL HISTORY: Melissa Roberson 29 y.o. female returns to the clinic today for six-month follow-up visit. The patient is feeling fine today was no specific complaints. 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 She denied having any significant hot flashes or diarrhea. She denied having any abdominal pain. The patient has no chest pain, shortness of breath, cough or hemoptysis. She had blood work performed recently and she is here for evaluation and discussion of her scan results.  MEDICAL HISTORY: Past Medical History  Diagnosis Date  . Anxiety     ALLERGIES:  is allergic to codeine.  MEDICATIONS:  Current Outpatient Prescriptions  Medication Sig Dispense Refill  . acetaminophen (TYLENOL) 325 MG tablet Take 2 tablets (650 mg total) by mouth every 6 (six) hours as needed for mild pain, moderate pain, fever or headache (You can only take 4000 mg of Tylenol(acetaminophen) per day, this is in your prescribed pain medicine so you have to add that to any other you take.).    Marland Kitchen Lysine 1000 MG TABS Take 1,000 mg by mouth daily.    . Multiple Vitamin (MULTIVITAMIN) capsule Take 1 capsule by mouth daily.    Marland Kitchen ibuprofen (ADVIL,MOTRIN) 800 MG tablet You can take one of these as directed, or you can get over the counter Ibuprofen, and use 2-3 tablets every 6 hours as needed. (Patient not taking: Reported on 01/20/2014) 30 tablet 0   No current facility-administered medications for this visit.    SURGICAL HISTORY:  Past Surgical History  Procedure Laterality Date  . Cesarean section    .  Laparoscopic appendectomy N/A 06/29/2013    Procedure: APPENDECTOMY LAPAROSCOPIC;  Surgeon: Merrie Roof, MD;  Location: [facility];  Service: General;  Laterality: N/A;  . Appendectomy      REVIEW OF SYSTEMS:  A comprehensive review of systems was negative.   PHYSICAL EXAMINATION: General appearance: alert, cooperative and no distress Head: Normocephalic, without obvious abnormality, atraumatic Neck: no adenopathy, no JVD, supple, symmetrical, trachea midline and thyroid not enlarged, symmetric, no tenderness/mass/nodules Lymph nodes: Cervical, supraclavicular, and axillary nodes normal. Resp: clear to auscultation bilaterally Back: symmetric, no curvature. ROM normal. No CVA tenderness. Cardio: regular rate and rhythm, S1, S2 normal, no murmur, click, rub or gallop GI: soft, non-tender; bowel sounds normal; no masses,  no organomegaly Extremities: extremities normal, atraumatic, no cyanosis or edema  ECOG PERFORMANCE STATUS: 1 - Symptomatic but completely ambulatory  Blood pressure 129/76, pulse 71, temperature 98.9 F (37.2 C), temperature source Oral, resp. rate 18, height 5\' 7"  (1.702 m), weight 169 lb 11.2 oz (76.975 kg), SpO2 100 %.  LABORATORY DATA: Lab Results  Component Value Date   WBC 4.0 06/30/2014   HGB 14.3 06/30/2014   HCT 42.8 06/30/2014   MCV 91.7 06/30/2014   PLT 230 06/30/2014      Chemistry      Component Value Date/Time   NA 140 06/30/2014 0854   NA 143 06/29/2013 1227   K 4.1 06/30/2014 0854   K 3.6* 06/29/2013 1227   CL 109 06/29/2013 0518   CO2 24 06/30/2014 0854  CO2 24 06/29/2013 0510   BUN 10.1 06/30/2014 0854   BUN 10 06/29/2013 0518   CREATININE 0.8 06/30/2014 0854   CREATININE 0.70 06/29/2013 0518      Component Value Date/Time   CALCIUM 8.9 06/30/2014 0854   CALCIUM 9.0 06/29/2013 0510   ALKPHOS 51 06/30/2014 0854   ALKPHOS 51 06/29/2013 0510   AST 17 06/30/2014 0854   AST 15 06/29/2013 0510   ALT 18 06/30/2014 0854   ALT 14  06/29/2013 0510   BILITOT 0.57 06/30/2014 0854   BILITOT 0.5 06/29/2013 0510       RADIOGRAPHIC STUDIES: No results found.  ASSESSMENT AND PLAN: This is a very pleasant 29 years old white female with history of stage II appendiceal carcinoid tumor status post appendectomy. The patient is feeling fine. Her blood work is also within the normal limit but the chromogranin A is still pending. I discussed the scan and lab result with the patient today. I recommended for her to continue on observation. I will see her back for follow-up visit in 6 months with repeat blood work, including CBC, comprehensive metabolic panel, LDH, CEA and chromogranin A as well as CT scan of the abdomen and pelvis. She was advised to call immediately if she has any concerning symptoms in the interval. The patient voices understanding of current disease status and treatment options and is in agreement with the current care plan.  All questions were answered. The patient knows to call the clinic with any problems, questions or concerns. We can certainly see the patient much sooner if necessary.  Disclaimer: This note was dictated with voice recognition software. Similar sounding words can inadvertently be transcribed and may not be corrected upon review.

## 2014-07-04 LAB — CHROMOGRANIN A: Chromogranin A: 8 ng/mL (ref <=15)

## 2014-07-07 ENCOUNTER — Ambulatory Visit: Payer: BC Managed Care – PPO | Admitting: Internal Medicine

## 2014-12-03 ENCOUNTER — Telehealth: Payer: Self-pay | Admitting: *Deleted

## 2014-12-03 DIAGNOSIS — C7A00 Malignant carcinoid tumor of unspecified site: Secondary | ICD-10-CM

## 2014-12-03 NOTE — Telephone Encounter (Signed)
Pt called with request for CT scan to be done at [facility] as it will be cheaper. New order entered,POF to scheduler Discussed with pt she will need to have labs prior to CT scan. Pt will call back to r/s lab appt if needed.

## 2014-12-24 ENCOUNTER — Telehealth: Payer: Self-pay | Admitting: *Deleted

## 2014-12-24 NOTE — Telephone Encounter (Signed)
Call received from patient to "cancel F/U with Dr. Julien Nordmann Monday 12-28-2014.  I have not had my scans or heard from anyone about scans.  I was told I would receive a call, that there wasn't anything I needed to do.  I will call back to reschedule F/U with Dr. Julien Nordmann when I have scans."    This nurse called [facility].  "We are not in que for EPIC orders.  If you order something you need to fax the order so we know to call the patient."  This nurse scheduled CT scan for December 29, 2014 at 11:00 am.  Left GI number [phone] as she can reschedule to suit her scheduling needs.  Called patient leaving voicemail with date, time, instructions of no solid food one hour before scans and to pick up contrast at [address]  At least a day before scans.  Asked for return call to ensure she received this message.  Awaiting return call from patient.

## 2014-12-28 ENCOUNTER — Other Ambulatory Visit: Payer: BLUE CROSS/BLUE SHIELD

## 2014-12-29 ENCOUNTER — Other Ambulatory Visit: Payer: BLUE CROSS/BLUE SHIELD

## 2015-01-04 ENCOUNTER — Ambulatory Visit: Payer: BLUE CROSS/BLUE SHIELD | Admitting: Internal Medicine

## 2015-01-04 NOTE — Telephone Encounter (Signed)
Observed appointment note for 12-29-2014 CT that patient cancelled and will call back to schedule scans when she knows her schedule.  Closed awaiting return call from patient to reschedule F/U after she re-schedules scans.

## 2016-03-22 ENCOUNTER — Telehealth: Payer: Self-pay | Admitting: Medical Oncology

## 2016-03-22 NOTE — Telephone Encounter (Signed)
Wants to get a follow-up scan from last visit 07/02/2014.

## 2016-03-22 NOTE — Telephone Encounter (Signed)
OK to see her with lab and scan

## 2016-03-23 ENCOUNTER — Other Ambulatory Visit: Payer: Self-pay | Admitting: Medical Oncology

## 2016-03-23 DIAGNOSIS — C7A00 Malignant carcinoid tumor of unspecified site: Secondary | ICD-10-CM

## 2016-03-24 ENCOUNTER — Telehealth: Payer: Self-pay | Admitting: Internal Medicine

## 2016-03-24 NOTE — Telephone Encounter (Signed)
sw pt to inform her of lab/ov appt in Feb 22018. Inform pt that [facility] will call pt to sch CT scans. Pt ware to pick bottles of contrast of [facility]

## 2016-04-04 ENCOUNTER — Other Ambulatory Visit: Payer: BLUE CROSS/BLUE SHIELD

## 2016-04-06 ENCOUNTER — Telehealth: Payer: Self-pay | Admitting: Medical Oncology

## 2016-04-06 ENCOUNTER — Telehealth: Payer: Self-pay | Admitting: Internal Medicine

## 2016-04-06 NOTE — Telephone Encounter (Signed)
Patient called to cancel appointment with Dr Julien Nordmann because she has not gotten her scans or lab work done will reschedule when that has been done

## 2016-04-10 ENCOUNTER — Ambulatory Visit: Payer: BLUE CROSS/BLUE SHIELD | Admitting: Internal Medicine

## 2018-02-04 DIAGNOSIS — T169XXA Foreign body in ear, unspecified ear, initial encounter: Secondary | ICD-10-CM | POA: Diagnosis not present

## 2018-03-22 DIAGNOSIS — J111 Influenza due to unidentified influenza virus with other respiratory manifestations: Secondary | ICD-10-CM | POA: Diagnosis not present

## 2018-03-22 DIAGNOSIS — R69 Illness, unspecified: Secondary | ICD-10-CM | POA: Diagnosis not present

## 2018-03-22 DIAGNOSIS — R5383 Other fatigue: Secondary | ICD-10-CM | POA: Diagnosis not present

## 2018-03-22 DIAGNOSIS — J029 Acute pharyngitis, unspecified: Secondary | ICD-10-CM | POA: Diagnosis not present

## 2018-03-22 DIAGNOSIS — R5381 Other malaise: Secondary | ICD-10-CM | POA: Diagnosis not present

## 2018-04-09 DIAGNOSIS — R300 Dysuria: Secondary | ICD-10-CM | POA: Diagnosis not present

## 2018-04-09 DIAGNOSIS — N390 Urinary tract infection, site not specified: Secondary | ICD-10-CM | POA: Diagnosis not present

## 2018-06-26 ENCOUNTER — Telehealth: Payer: Self-pay | Admitting: Medical Oncology

## 2018-06-26 NOTE — Telephone Encounter (Signed)
She can see her PCP or urgent care.

## 2018-06-26 NOTE — Telephone Encounter (Signed)
Pt.notified

## 2018-06-26 NOTE — Telephone Encounter (Signed)
Requests appt Night sweats,nausea, change in menstrual cycle ,wt loss in past 2 months .  Last appt 2016.  Hx no show and cancelling appts.

## 2019-01-08 DIAGNOSIS — R300 Dysuria: Secondary | ICD-10-CM | POA: Diagnosis not present

## 2019-01-08 DIAGNOSIS — Z760 Encounter for issue of repeat prescription: Secondary | ICD-10-CM | POA: Diagnosis not present

## 2019-01-08 DIAGNOSIS — N760 Acute vaginitis: Secondary | ICD-10-CM | POA: Diagnosis not present

## 2019-02-06 DIAGNOSIS — Z03818 Encounter for observation for suspected exposure to other biological agents ruled out: Secondary | ICD-10-CM | POA: Diagnosis not present

## 2019-02-06 DIAGNOSIS — Z20828 Contact with and (suspected) exposure to other viral communicable diseases: Secondary | ICD-10-CM | POA: Diagnosis not present

## 2019-04-27 ENCOUNTER — Ambulatory Visit: Payer: Self-pay

## 2019-05-15 ENCOUNTER — Ambulatory Visit: Payer: Self-pay | Attending: Internal Medicine

## 2019-05-15 DIAGNOSIS — Z23 Encounter for immunization: Secondary | ICD-10-CM

## 2019-05-15 NOTE — Progress Notes (Signed)
   Covid-19 Vaccination Clinic  Name:  Melissa Roberson    MRN: [id number] DOB: 1985-09-12  05/15/2019  [name] was observed post Covid-19 immunization for 15 minutes without incident. She was provided with Vaccine Information Sheet and instruction to access the V-Safe system.   [name] was instructed to call 911 with any severe reactions post vaccine: Marland Kitchen Difficulty breathing  . Swelling of face and throat  . A fast heartbeat  . A bad rash all over body  . Dizziness and weakness   Immunizations Administered    Name Date Dose VIS Date Route   Pfizer COVID-19 Vaccine 05/15/2019  5:04 PM 0.3 mL 01/31/2019 Intramuscular   Manufacturer: [place]   Lot: [id number]   [place]: [id number]

## 2019-06-09 DIAGNOSIS — Z01419 Encounter for gynecological examination (general) (routine) without abnormal findings: Secondary | ICD-10-CM | POA: Diagnosis not present

## 2019-06-09 DIAGNOSIS — R61 Generalized hyperhidrosis: Secondary | ICD-10-CM | POA: Diagnosis not present

## 2019-06-10 ENCOUNTER — Ambulatory Visit: Payer: Self-pay

## 2019-06-11 ENCOUNTER — Other Ambulatory Visit: Payer: Self-pay

## 2019-06-17 ENCOUNTER — Ambulatory Visit: Payer: Self-pay | Attending: Internal Medicine

## 2019-06-17 DIAGNOSIS — Z23 Encounter for immunization: Secondary | ICD-10-CM

## 2019-06-17 NOTE — Progress Notes (Signed)
   Covid-19 Vaccination Clinic  Name:  FREADA ALSTON    MRN: [id number] DOB: [birth year]  06/17/2019  Ms. Regan was observed post Covid-19 immunization for 15 minutes without incident. She was provided with Vaccine Information Sheet and instruction to access the V-Safe system.   Ms. Maxie was instructed to call 911 with any severe reactions post vaccine: Marland Kitchen Difficulty breathing  . Swelling of face and throat  . A fast heartbeat  . A bad rash all over body  . Dizziness and weakness   Immunizations Administered    Name Date Dose VIS Date Route   Pfizer COVID-19 Vaccine 06/17/2019  4:41 PM 0.3 mL 04/16/2018 Intramuscular   Manufacturer: [place]   Lot: [id number]   [place]: [id number]

## 2019-06-18 ENCOUNTER — Ambulatory Visit: Payer: Self-pay

## 2019-09-17 ENCOUNTER — Ambulatory Visit: Payer: HRSA Program | Attending: Internal Medicine

## 2019-09-17 DIAGNOSIS — Z20822 Contact with and (suspected) exposure to covid-19: Secondary | ICD-10-CM | POA: Diagnosis present

## 2019-09-18 LAB — NOVEL CORONAVIRUS, NAA: SARS-CoV-2, NAA: NOT DETECTED

## 2019-09-18 LAB — SARS-COV-2, NAA 2 DAY TAT

## 2019-12-05 DIAGNOSIS — Z20822 Contact with and (suspected) exposure to covid-19: Secondary | ICD-10-CM | POA: Diagnosis not present

## 2019-12-05 DIAGNOSIS — G43901 Migraine, unspecified, not intractable, with status migrainosus: Secondary | ICD-10-CM | POA: Diagnosis not present

## 2019-12-10 DIAGNOSIS — R1011 Right upper quadrant pain: Secondary | ICD-10-CM | POA: Diagnosis not present

## 2019-12-10 DIAGNOSIS — Z0000 Encounter for general adult medical examination without abnormal findings: Secondary | ICD-10-CM | POA: Diagnosis not present

## 2019-12-10 DIAGNOSIS — R1013 Epigastric pain: Secondary | ICD-10-CM | POA: Diagnosis not present

## 2019-12-10 DIAGNOSIS — Z859 Personal history of malignant neoplasm, unspecified: Secondary | ICD-10-CM | POA: Diagnosis not present

## 2019-12-10 DIAGNOSIS — Z13220 Encounter for screening for lipoid disorders: Secondary | ICD-10-CM | POA: Diagnosis not present

## 2019-12-10 DIAGNOSIS — Z23 Encounter for immunization: Secondary | ICD-10-CM | POA: Diagnosis not present

## 2019-12-23 ENCOUNTER — Telehealth: Payer: Self-pay | Admitting: Hematology and Oncology

## 2019-12-23 NOTE — Telephone Encounter (Signed)
A new pt appt has been scheduled for Melissa Roberson to see Dr. Lorenso Courier on 12/1 at [place]. Pt aware to arrive 15 minutes early.

## 2020-01-20 NOTE — Progress Notes (Signed)
No show

## 2020-01-21 ENCOUNTER — Encounter: Payer: Self-pay | Admitting: Hematology and Oncology

## 2020-01-21 ENCOUNTER — Other Ambulatory Visit: Payer: Self-pay

## 2020-01-28 DIAGNOSIS — Z20822 Contact with and (suspected) exposure to covid-19: Secondary | ICD-10-CM | POA: Diagnosis not present

## 2020-01-28 DIAGNOSIS — R519 Headache, unspecified: Secondary | ICD-10-CM | POA: Diagnosis not present

## 2020-01-28 DIAGNOSIS — J029 Acute pharyngitis, unspecified: Secondary | ICD-10-CM | POA: Diagnosis not present

## 2020-01-28 DIAGNOSIS — J069 Acute upper respiratory infection, unspecified: Secondary | ICD-10-CM | POA: Diagnosis not present

## 2020-03-16 ENCOUNTER — Telehealth: Payer: Self-pay | Admitting: Hematology and Oncology

## 2020-03-16 NOTE — Telephone Encounter (Signed)
Melissa Roberson cld to reschedule her missed appt from December to 2/11 at [place].

## 2020-04-02 ENCOUNTER — Inpatient Hospital Stay: Payer: BC Managed Care – PPO

## 2020-04-02 ENCOUNTER — Other Ambulatory Visit: Payer: Self-pay

## 2020-04-02 ENCOUNTER — Inpatient Hospital Stay: Payer: BC Managed Care – PPO | Attending: Hematology and Oncology | Admitting: Hematology and Oncology

## 2020-04-02 ENCOUNTER — Encounter: Payer: Self-pay | Admitting: Hematology and Oncology

## 2020-04-02 VITALS — BP 143/85 | HR 98 | Temp 97.7°F | Resp 20 | Ht 67.0 in | Wt 184.3 lb

## 2020-04-02 DIAGNOSIS — M545 Low back pain, unspecified: Secondary | ICD-10-CM | POA: Diagnosis not present

## 2020-04-02 DIAGNOSIS — R61 Generalized hyperhidrosis: Secondary | ICD-10-CM | POA: Diagnosis not present

## 2020-04-02 DIAGNOSIS — R110 Nausea: Secondary | ICD-10-CM | POA: Diagnosis not present

## 2020-04-02 DIAGNOSIS — F17290 Nicotine dependence, other tobacco product, uncomplicated: Secondary | ICD-10-CM | POA: Insufficient documentation

## 2020-04-02 DIAGNOSIS — D3A020 Benign carcinoid tumor of the appendix: Secondary | ICD-10-CM

## 2020-04-02 DIAGNOSIS — Z800 Family history of malignant neoplasm of digestive organs: Secondary | ICD-10-CM | POA: Diagnosis not present

## 2020-04-02 DIAGNOSIS — Z8509 Personal history of malignant neoplasm of other digestive organs: Secondary | ICD-10-CM | POA: Diagnosis not present

## 2020-04-02 NOTE — Progress Notes (Signed)
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  1) 06/29/2013: laparoscopic appendectomy performed, incidentally found to have low grade neuroendocrine tumor, approximately 0.7 cm in diameter.  2) 07/16/2013: establish care with Dr. Earlie Server at [facility] 3) 01/13/2014: CT Abdomen/Pelvis shows no evidence of residual/recurrent disease.  4) 07/02/2014: last visit with Dr. Earlie Server. Lost to follow up.  5) 01/21/2020: establish car with Dr. Lorenso Courier   CHIEF COMPLAINTS/PURPOSE OF CONSULTATION:  "Carcinoid Tumor of the Appendix "  HISTORY OF PRESENTING ILLNESS:  Melissa Roberson 35 y.o. 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 Lorenso Courier.   On review of the previous records Melissa Roberson presented to the emergency department on 06/29/2013 due to abdominal pain with nausea and vomiting.  She separately had a CT scan which showed evidence of appendicitis.  On the same day she underwent a laparoscopic appendectomy and on pathological review was incidentally found to have a low-grade neuroendocrine tumor approximately 0.7 cm in diameter.  She established care with Dr. Julien Nordmann here at the [facility] on 07/16/2013.  She subsequently had a CT abdomen pelvis on 01/13/2014 which showed no evidence of residual or recurrent disease.  She was last seen by Dr. Julien Nordmann on 07/02/2014, however after that time was lost to follow-up.  Today she presents in order to reestablish care.  On exam today Melissa Roberson ports that she has been having some concerning symptoms which made her want to reestablish care.  She feels like she is quite in tune with her body as she is practicing meditation.   She has had numerous physicians evaluate her over the last several years due to these concerning symptoms.  She reports that they began in approximately May 2020 at which time she was told she had a bacterial vaginosis.  She was having severe night sweats and subsequent developed migraines.  She notes that these headaches of been quite severe and some of them have lasted for as long as 3 days.  She notes that she has been having some night sweats, abdominal pain, fatigue, and irregular periods.  She notes that there was one period of time where she went 43 days without one period.   On further discussion she is also been having some abdominal discomfort with lower back pain and some pain on her right side.  This has been associated with some nausea.  She was previously told by Dr. 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  She has undergone 2 C-section surgeries as well as an appendectomy.  She did suffer from PID when she was 35 years old.  She is also concerned about some floaters that she has seen in her vision.  Regarding her family history her maternal uncle died of pancreatic cancer and on her dad's side there is a history of kidney disease.  Her mom has heart disease and she has 2 children ages 85 and 27 who are both healthy.  She does currently vape and use about 1 Juul pack every 3 days.  She was a previously a smoker and quit in 2015.  She currently works as a Education officer, museum.  She endorses having alternating diarrhea and constipation but  her appetite has been okay.  She is also had some breast tenderness.  Her weight has been stable at 170.  A full 10 point ROS is listed below.  MEDICAL HISTORY:  Past Medical History:  Diagnosis Date  . Anxiety     SURGICAL HISTORY: Past Surgical History:  Procedure Laterality Date  . APPENDECTOMY    . CESAREAN SECTION    . LAPAROSCOPIC APPENDECTOMY N/A 06/29/2013   Procedure: APPENDECTOMY LAPAROSCOPIC;  Surgeon: Merrie Roof,  MD;  Location: [facility];  Service: General;  Laterality: N/A;    SOCIAL HISTORY: Social History   Socioeconomic History  . Marital status: Married    Spouse name: Not on file  . Number of children: Not on file  . Years of education: Not on file  . Highest education level: Not on file  Occupational History  . Not on file  Tobacco Use  . Smoking status: Former Smoker    Packs/day: 1.00    Years: 10.00    Pack years: 10.00    Types: Cigarettes    Quit date: 06/25/2013    Years since quitting: 6.7  . Smokeless tobacco: Never Used  . Tobacco comment: Only smokes when drinks ETOH  Substance and Sexual Activity  . Alcohol use: Yes    Comment: Socially  . Drug use: No  . Sexual activity: Yes    Birth control/protection: None  Other Topics Concern  . Not on file  Social History Narrative  . Not on file   Social Determinants of Health   Financial Resource Strain: Not on file  Food Insecurity: Not on file  Transportation Needs: Not on file  Physical Activity: Not on file  Stress: Not on file  Social Connections: Not on file  Intimate Partner Violence: Not on file    FAMILY HISTORY: History reviewed. No pertinent family history.  ALLERGIES:  is allergic to codeine.  MEDICATIONS:  Current Outpatient Medications  Medication Sig Dispense Refill  . ibuprofen (ADVIL,MOTRIN) 800 MG tablet You can take one of these as directed, or you can get over the counter Ibuprofen, and use 2-3 tablets every 6 hours as needed. 30 tablet 0  . acetaminophen (TYLENOL) 325 MG tablet Take 2 tablets (650 mg total) by mouth every 6 (six) hours as needed for mild pain, moderate pain, fever or headache (You can only take 4000 mg of Tylenol(acetaminophen) per day, this is in your prescribed pain medicine so you have to add that to any other you take.).    Marland Kitchen Lysine 1000 MG TABS Take 1,000 mg by mouth daily.     No current facility-administered medications for this visit.    REVIEW OF SYSTEMS:    Constitutional: ( - ) fevers, ( - )  chills , ( - ) night sweats Eyes: ( - ) blurriness of vision, ( - ) double vision, ( - ) watery eyes Ears, nose, mouth, throat, and face: ( - ) mucositis, ( - ) sore throat Respiratory: ( - ) cough, ( - ) dyspnea, ( - ) wheezes Cardiovascular: ( - ) palpitation, ( - ) chest discomfort, ( - ) lower extremity swelling Gastrointestinal:  ( - ) nausea, ( - ) heartburn, ( - ) change in bowel habits Skin: ( - ) abnormal skin rashes Lymphatics: ( - ) new lymphadenopathy, ( - ) easy bruising Neurological: ( - ) numbness, ( - ) tingling, ( - ) new weaknesses Behavioral/Psych: ( - ) mood change, ( - ) new  changes  All other systems were reviewed with the patient and are negative.  PHYSICAL EXAMINATION: ECOG PERFORMANCE STATUS: 0 - Asymptomatic  Vitals:   04/02/20 0858  BP: (!) 143/85  Pulse: 98  Resp: 20  Temp: 97.7 F (36.5 C)  SpO2: 100%   Filed Weights   04/02/20 0858  Weight: 184 lb 4.8 oz (83.6 kg)    GENERAL: well appearing young Caucasian female in NAD  SKIN: skin color, texture, turgor are normal, no rashes or significant lesions EYES: conjunctiva are pink and non-injected, sclera clear LUNGS: clear to auscultation and percussion with normal breathing effort HEART: regular rate & rhythm and no murmurs and no lower extremity edema Musculoskeletal: no cyanosis of digits and no clubbing  PSYCH: alert & oriented x 3, fluent speech NEURO: no focal motor/sensory deficits  LABORATORY DATA:  I have reviewed the data as listed CBC Latest Ref Rng & Units 06/30/2014 01/13/2014 07/16/2013  WBC 3.9 - 10.3 10e3/uL 4.0 4.5 7.0  Hemoglobin 11.6 - 15.9 g/dL 14.3 14.2 13.4  Hematocrit 34.8 - 46.6 % 42.8 42.7 40.4  Platelets 145 - 400 10e3/uL 230 255 260    CMP Latest Ref Rng & Units 06/30/2014 01/13/2014 07/16/2013  Glucose 70 - 140 mg/dl 86 84 87  BUN 7.0 - 26.0 mg/dL 10.1 11.1 11.1  Creatinine 0.6 - 1.1 mg/dL 0.8 0.7 0.7  Sodium 136 - 145 mEq/L 140  140 140  Potassium 3.5 - 5.1 mEq/L 4.1 4.3 4.1  Chloride 96 - 112 mEq/L - - -  CO2 22 - 29 mEq/L 24 27 24   Calcium 8.4 - 10.4 mg/dL 8.9 10.0 9.4  Total Protein 6.4 - 8.3 g/dL 7.3 8.0 7.1  Total Bilirubin 0.20 - 1.20 mg/dL 0.57 0.45 0.26  Alkaline Phos 40 - 150 U/L 51 70 59  AST 5 - 34 U/L 17 18 15   ALT 0 - 55 U/L 18 23 14      PATHOLOGY:   RADIOGRAPHIC STUDIES: No results found.  ASSESSMENT & PLAN MELESA LECY 35 y.o. 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 Lorenso Courier.    After review of the records and discussion with the patient the findings most consistent with a remote history of carcinoid tumor of the appendix.  Given prior studies there is a remarkably low chance of recurrence or metastasis for this type of tumor.  She does have an interesting constellation of symptoms, however I do not believe that they merit a CT chest abdomen pelvis based on her cancer history.  I would recommend that she consider evaluation with her PCP and OB/GYN for management of the symptoms.  We have strongly encourage her to follow-up with his other providers for the symptoms.  In the event she was found to have any lesions in the abdomen or concern for recurrent cancer we would be happy to see her back in our clinic.  She declined receive labs today.  # Stage II (T3, NX, MX) Carcinoid Tumor of the Appendix --Tumor status post resection with original size of 0.7 cm.  Given the size of this tumor the odds of metastasis or recurrence is approximately 0% based on prior studies. --Encouraged her to continue with the care of her PCP and OB/GYN for the symptoms. --Offered blood work including chromogranin, CMP, CBC, TSH, prolactin were to help evaluate her symptoms.  She declines to have these performed today. --Patient notes that she will  continue evaluation with her primary care provider and establish  care with an OB/GYN. --Return to clinic as needed  Orders Placed This Encounter  Procedures  . CBC with Differential (Cancer Center Only)    Standing Status:   Future    Number of Occurrences:   1    Standing Expiration Date:   04/02/2021    All questions were answered. The patient knows to call the clinic with any problems, questions or concerns.  A total of more than 60 minutes were spent on this encounter and over half of that time was spent on counseling and coordination of care as outlined above.   Ledell Peoples, MD Department of Hematology/Oncology [facility] Phone: [phone] Pager: [phone] Email: [email or web address]  04/07/2020 4:17 PM   Literature Support:  Myrla Halsted, Michail O. Appendiceal neuroendocrine tumors: Recent insights and clinical implications. World J Gastrointest Oncol. 2010;2(4):192-196  --Current guidelines propose simple appendectomy as adequate and curative for the treatment of appendiceal NETs < 1 cm, while for tumors 1-2 cm, a simple appendectomy followed by periodic postoperative follow-up for 5 years is recommended.

## 2020-04-05 ENCOUNTER — Telehealth: Payer: Self-pay

## 2020-04-05 ENCOUNTER — Telehealth: Payer: Self-pay | Admitting: Hematology and Oncology

## 2020-04-05 NOTE — Telephone Encounter (Signed)
Made no changes to pt's schedule per 2/11 LOS.

## 2020-04-05 NOTE — Telephone Encounter (Signed)
Pt left a message stating she had an appt with Dr. Lorenso Courier but possibly wants to see Dr. Julien Nordmann because she has seen him previously.   I have left the pt a message advising to call back and request to schedule this appt if this was truly the purpose of her call (it was a little unclear) and if not, she can still call the same number and request to be transferred to the desk.

## 2020-04-22 DIAGNOSIS — F419 Anxiety disorder, unspecified: Secondary | ICD-10-CM | POA: Diagnosis not present

## 2020-05-05 DIAGNOSIS — N926 Irregular menstruation, unspecified: Secondary | ICD-10-CM | POA: Diagnosis not present

## 2020-05-05 DIAGNOSIS — R61 Generalized hyperhidrosis: Secondary | ICD-10-CM | POA: Diagnosis not present

## 2020-05-05 DIAGNOSIS — R8290 Unspecified abnormal findings in urine: Secondary | ICD-10-CM | POA: Diagnosis not present

## 2020-05-05 DIAGNOSIS — R109 Unspecified abdominal pain: Secondary | ICD-10-CM | POA: Diagnosis not present

## 2020-05-26 DIAGNOSIS — F17200 Nicotine dependence, unspecified, uncomplicated: Secondary | ICD-10-CM | POA: Diagnosis not present

## 2020-05-26 DIAGNOSIS — F419 Anxiety disorder, unspecified: Secondary | ICD-10-CM | POA: Diagnosis not present

## 2020-06-02 DIAGNOSIS — F419 Anxiety disorder, unspecified: Secondary | ICD-10-CM | POA: Diagnosis not present

## 2021-03-22 ENCOUNTER — Other Ambulatory Visit: Payer: Self-pay | Admitting: Physician Assistant

## 2021-03-22 DIAGNOSIS — R61 Generalized hyperhidrosis: Secondary | ICD-10-CM

## 2021-03-22 DIAGNOSIS — R634 Abnormal weight loss: Secondary | ICD-10-CM

## 2021-03-22 DIAGNOSIS — Z859 Personal history of malignant neoplasm, unspecified: Secondary | ICD-10-CM

## 2021-04-05 ENCOUNTER — Ambulatory Visit
Admission: RE | Admit: 2021-04-05 | Discharge: 2021-04-05 | Disposition: A | Discharge status: Home / Self Care | Payer: BC Managed Care – PPO | Source: Ambulatory Visit | Attending: Physician Assistant | Admitting: Physician Assistant

## 2021-04-05 DIAGNOSIS — R61 Generalized hyperhidrosis: Secondary | ICD-10-CM

## 2021-04-05 DIAGNOSIS — R634 Abnormal weight loss: Secondary | ICD-10-CM

## 2021-04-05 DIAGNOSIS — Z859 Personal history of malignant neoplasm, unspecified: Secondary | ICD-10-CM

## 2021-04-05 IMAGING — MR MR PELVIS WO/W CM
28 of 29 series · 45 of 48 positions shown · IV contrast (MULTIHANCE)
Comparison: None.

[DATE]

CLINICAL DATA: History of low-grade malignant carcinoid tumor of
the appendix status post surgical resection in [4M]. Unexplained
weight loss night sweats and fatigue x2 years.

EXAM:
MRI ABDOMEN AND PELVIS WITHOUT AND WITH CONTRAST
TECHNIQUE: Multiplanar multisequence MR imaging of the abdomen and pelvis was
performed both before and after the administration of intravenous
contrast.
CONTRAST:  15mL MULTIHANCE GADOBENATE DIMEGLUMINE 529 MG/ML IV SOLN

[Series 4: T2 · coronal · 5.0mm · 1.25mm/px · 1 of 28 slices shown (1 of 6)]
[im 1/28]
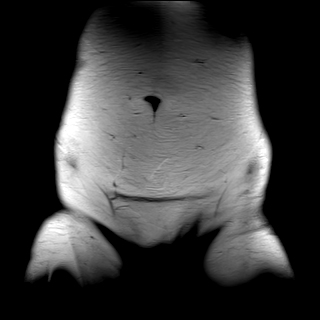

[Series 6: T2 · axial · 5.0mm · 0.44mm/px · 1 of 34 slices shown (2 of 6)]
[im 1/34]
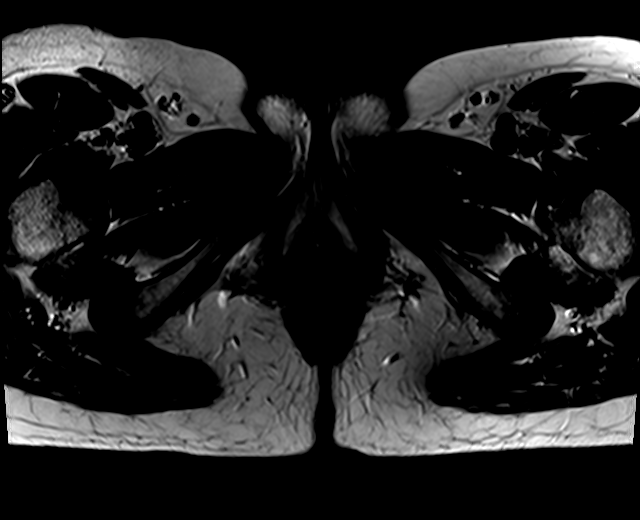

[Series 7: T2 · sagittal · 5.0mm · 0.78mm/px · 1 of 38 slices shown (3 of 6)]
[im 1/38]
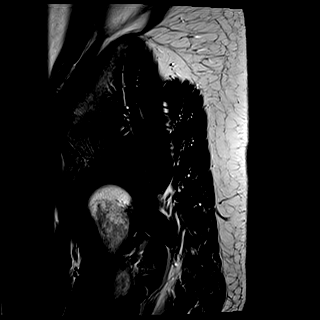

[Series 8: T2 fat-sat · axial · 5.0mm · 0.44mm/px · 1 of 34 slices shown]
[im 1/34]
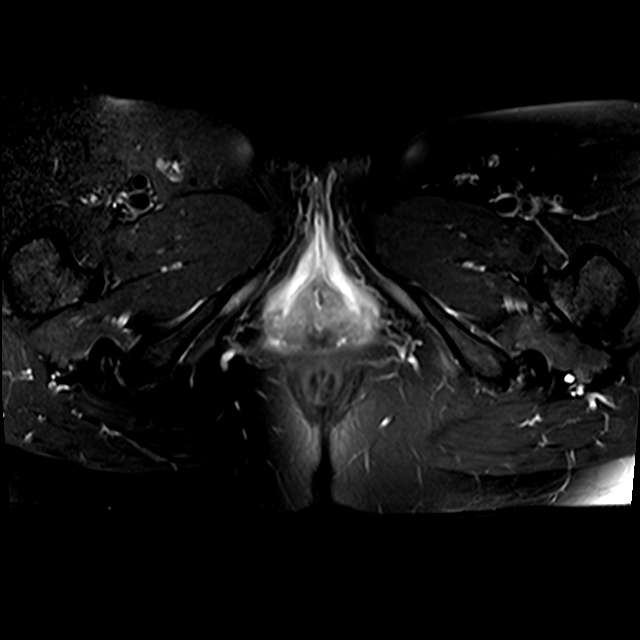

[Series 9: T1 · axial · 1.2mm · 0.75mm/px · z∈[-216,-6]mm · 2 of 176 slices shown (1 of 2)]
[im 1/176]
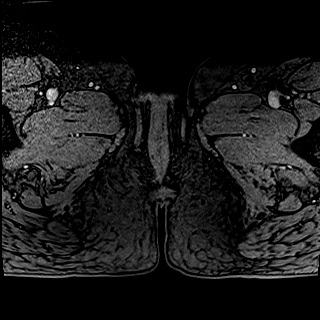
[im 176/176]
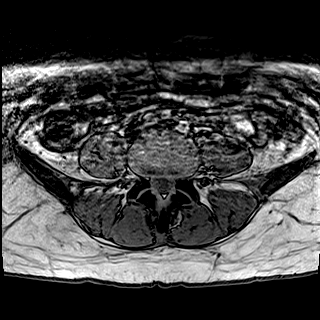

[Series 10: T1 fat-sat · axial · 1.2mm · 0.75mm/px · z∈[-216,-6]mm · 2 of 176 slices shown (1 of 2)]
[im 1/176]
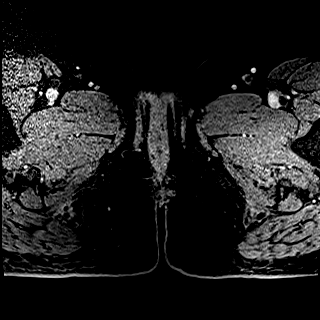
[im 176/176]
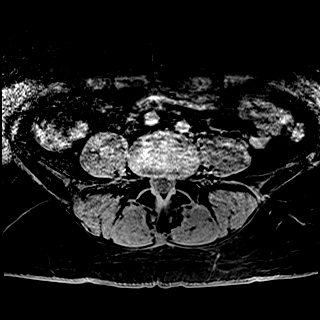

[Series 13: T2 · coronal · 5.0mm · 1.56mm/px · 1 of 36 slices shown (4 of 6)]
[im 1/36]
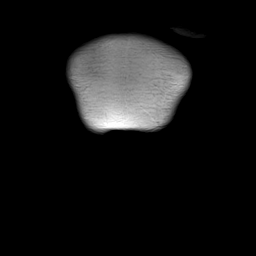

[Series 14: T1 · axial · 3.0mm · 1.19mm/px · z∈[-26,+211]mm · 2 of 160 slices shown (2 of 2)]
[im 1/160]
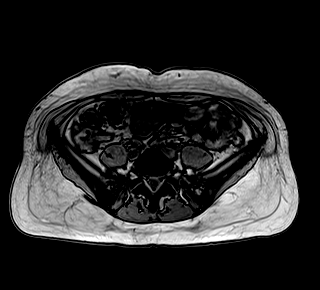
[im 160/160]
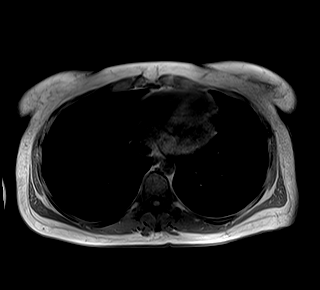

[Series 15: T2 · axial · 5.0mm · 1.48mm/px · 1 of 40 slices shown (5 of 6)]
[im 1/40]
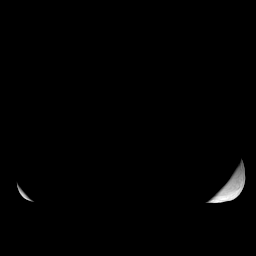

[Series 16: DWI · axial · 5.0mm · 1.42mm/px · 1 of 120 slices shown (1 of 2)]
[im 1/120]
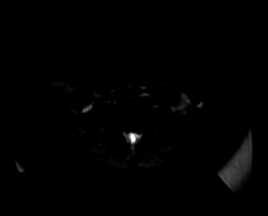

[Series 17: DWI · axial · 5.0mm · 1.42mm/px · 1 of 40 slices shown (2 of 2)]
[im 1/40]
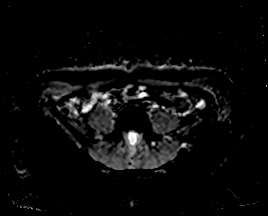

[Series 18: T2 · axial · 6.0mm · 1.22mm/px · 1 of 36 slices shown (6 of 6)]
[im 1/36]
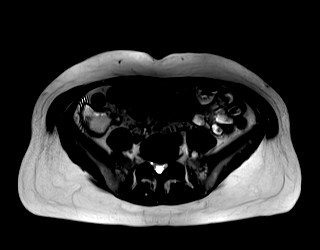

[Series 19: bSSFP · axial · 5.5mm · 1.25mm/px · 1 of 44 slices shown]
[im 1/44]
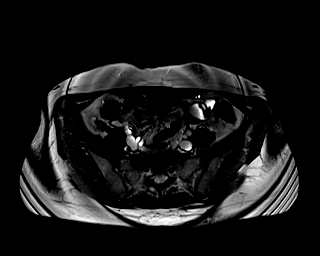

[Series 20: T1 dynamic · axial · non-contrast · 3.0mm · 1.19mm/px · 1 of 104 slices shown]
[im 1/104]
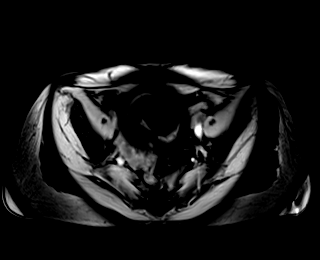

[Series 21: T1 dynamic post-contrast · axial · 3.0mm · 1.19mm/px · z∈[-90,+219]mm · 2 of 104 slices shown (1 of 12)]
[im 1/104]
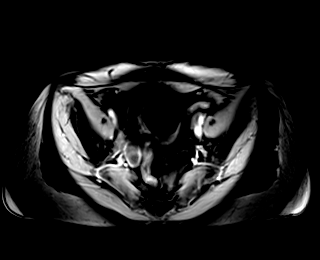
[im 104/104]
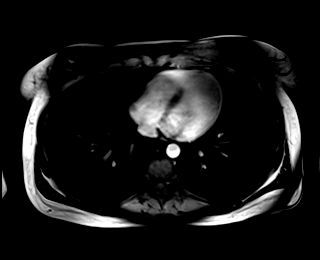

[Series 22: T1 dynamic post-contrast · axial · 3.0mm · 1.19mm/px · z∈[-90,+219]mm · 2 of 104 slices shown (2 of 12)]
[im 1/104]
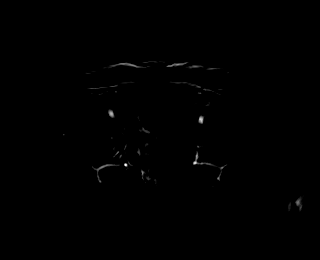
[im 104/104]
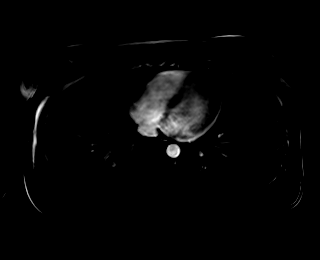

[Series 23: T1 dynamic post-contrast · axial · 3.0mm · 1.19mm/px · z∈[-90,+219]mm · 2 of 104 slices shown (3 of 12)]
[im 1/104]
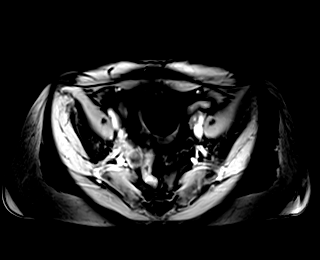
[im 104/104]
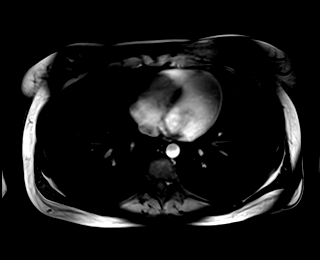

[Series 24: T1 dynamic post-contrast · axial · 3.0mm · 1.19mm/px · z∈[-90,+219]mm · 2 of 104 slices shown (4 of 12)]
[im 1/104]
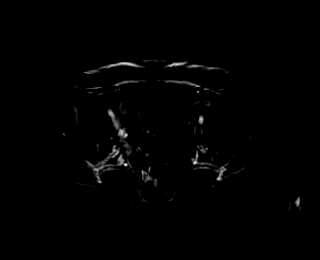
[im 104/104]
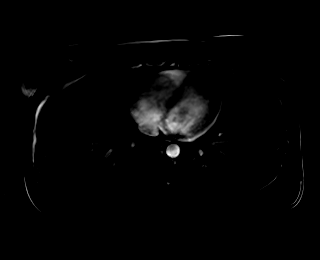

[Series 25: T1 dynamic post-contrast · axial · 3.0mm · 1.19mm/px · z∈[-90,+219]mm · 2 of 104 slices shown (5 of 12)]
[im 1/104]
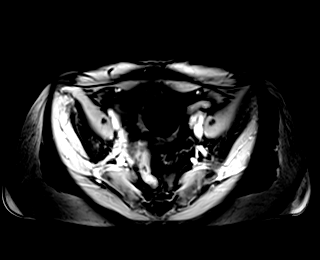
[im 104/104]
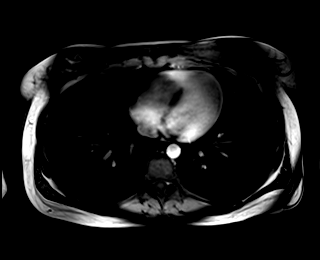

[Series 26: T1 dynamic post-contrast · axial · 3.0mm · 1.19mm/px · z∈[-90,+219]mm · 2 of 104 slices shown (6 of 12)]
[im 1/104]
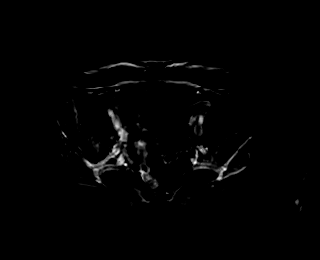
[im 104/104]
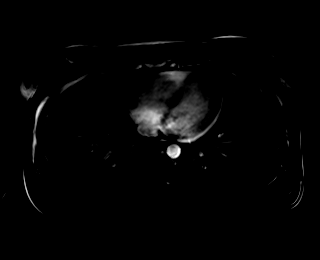

[Series 27: T1 dynamic post-contrast · coronal · 1.5mm · 1.38mm/px · 2 of 128 slices shown (7 of 12)]
[im 1/128]
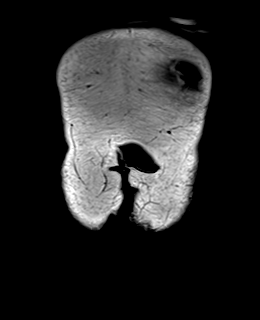
[im 128/128]
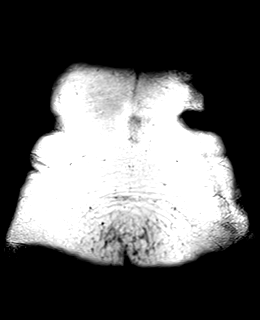

[Series 28: T1 dynamic post-contrast · coronal · 1.5mm · 1.38mm/px · 2 of 128 slices shown (8 of 12)]
[im 1/128]
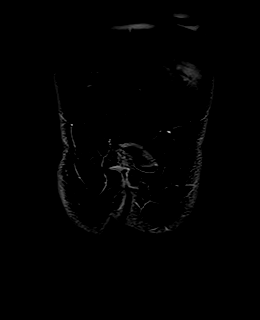
[im 128/128]
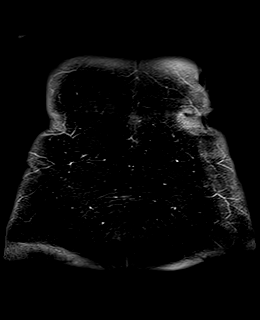

[Series 29: T1 dynamic post-contrast · axial · 3.0mm · 1.19mm/px · z∈[-90,+219]mm · 2 of 104 slices shown (9 of 12)]
[im 1/104]
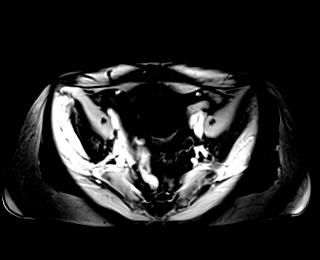
[im 104/104]
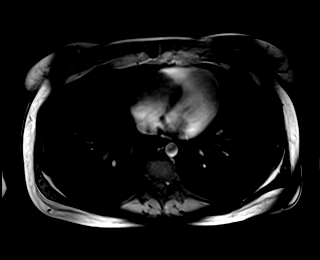

[Series 30: T1 dynamic post-contrast · axial · 3.0mm · 1.19mm/px · z∈[-90,+219]mm · 2 of 104 slices shown (10 of 12)]
[im 1/104]
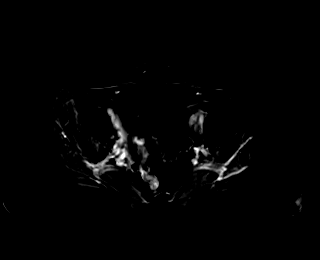
[im 104/104]
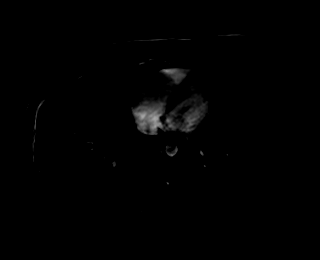

[Series 31: T1 dynamic post-contrast · axial · 3.0mm · 1.19mm/px · z∈[-90,+219]mm · 2 of 104 slices shown (11 of 12)]
[im 1/104]
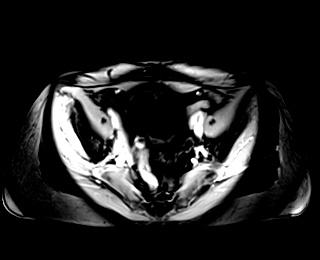
[im 104/104]
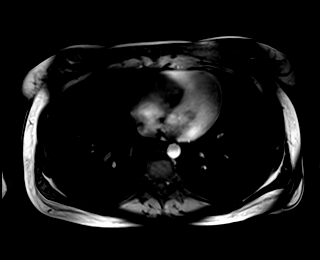

[Series 32: T1 dynamic post-contrast · axial · 3.0mm · 1.19mm/px · z∈[-90,+219]mm · 2 of 104 slices shown (12 of 12)]
[im 1/104]
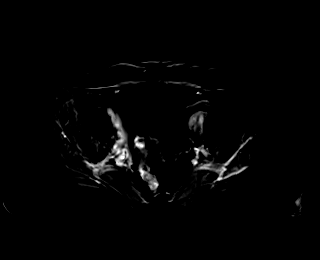
[im 104/104]
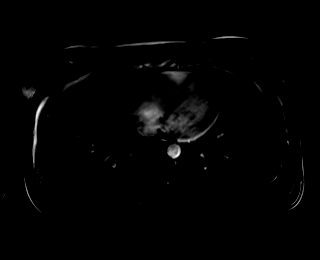

[Series 33: T1 fat-sat post-contrast · axial · 1.2mm · 0.75mm/px · z∈[-216,-6]mm · 3 of 176 slices shown]
[im 1/176]
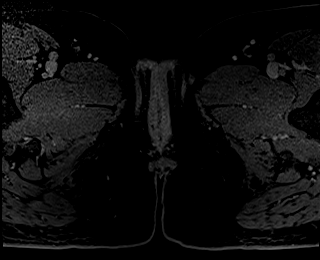
[im 88/176]
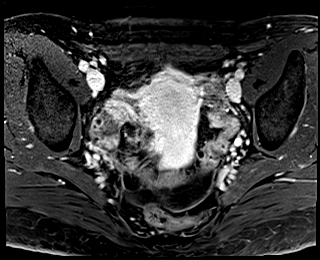
[im 176/176]
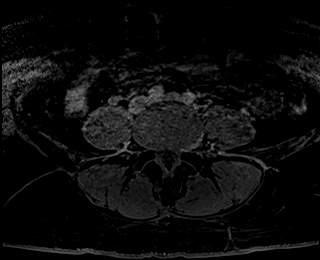

[Series 34: T1 fat-sat · coronal · 1.2mm · 0.81mm/px · 1 of 144 slices shown (2 of 2)]
[im 1/144]
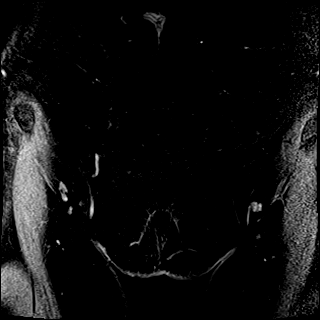

[45 of 48 positions shown; findings below may reference images not displayed]

FINDINGS: COMBINED FINDINGS FOR BOTH MR ABDOMEN AND PELVIS.

Evaluation of pelvis is limited by respiratory motion.

Lower chest: No acute abnormality.

Hepatobiliary: No hepatic steatosis. No suspicious hepatic lesion.
Gallbladder is distended without evidence of acute inflammation. No
biliary ductal dilation.

Pancreas: Intrinsic T1 signal a pancreatic parenchyma is within
normal limits. Homogeneous pancreatic parenchymal enhancement
postcontrast administration. No pancreatic ductal dilation. No
cystic or solid hyperenhancing pancreatic lesion identified

Spleen:  Within normal limits in size and appearance.

Adrenals/Urinary Tract: Bilateral adrenal glands appear normal. No
hydronephrosis. No solid enhancing renal mass.

Stomach/Bowel: Stomach is unremarkable. No pathologic dilation of
small or large bowel. No evidence of acute bowel inflammation. Prior
appendectomy, no abnormal enhancing soft tissue nodularity along the
cecum. Small volume of formed stool in the colon.

Vascular/Lymphatic: No abdominal aortic aneurysm. The portal,
splenic and superior mesenteric veins are patent. No pathologically
enlarged abdominal or pelvic lymph nodes.

Reproductive: Uterus measures 13 x 4.2 x 4.1 cm. Junctional zone
measures 2-3 mm. Endometrial stripe measures 6 mm. Bilateral ovaries
are within normal limits. Corpus luteal cyst in the right ovary. No
suspicious adnexal mass.

Other:  Trace pelvic free fluid within physiologic normal limits.

Musculoskeletal: No suspicious bone lesions identified.
IMPRESSION: No evidence of carcinoid tumor recurrence or metastatic disease in
the abdomen or pelvis on this study which is motion degraded through
the pelvis. If continued clinical concern for carcinoid tumor
recurrence/metastases consider further evaluation with PET-CT [4M]
Dotatate.

No acute abnormality in the abdomen or pelvis identified.

## 2021-04-05 IMAGING — MR MR ABDOMEN WO/W CM
28 of 29 series · 45 of 48 positions shown · IV contrast (MULTIHANCE)
Comparison: None.

[DATE]

CLINICAL DATA: History of low-grade malignant carcinoid tumor of
the appendix status post surgical resection in [4M]. Unexplained
weight loss night sweats and fatigue x2 years.

EXAM:
MRI ABDOMEN AND PELVIS WITHOUT AND WITH CONTRAST
TECHNIQUE: Multiplanar multisequence MR imaging of the abdomen and pelvis was
performed both before and after the administration of intravenous
contrast.
CONTRAST:  15mL MULTIHANCE GADOBENATE DIMEGLUMINE 529 MG/ML IV SOLN

[Series 3: T2 · coronal · 5.0mm · 1.25mm/px · 1 of 28 slices shown (1 of 6)]
[im 1/28]
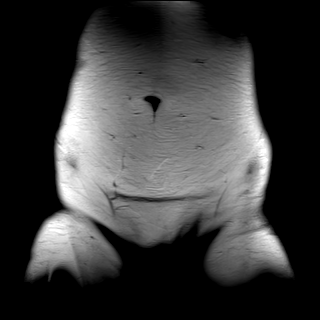

[Series 5: T2 · axial · 5.0mm · 0.44mm/px · 1 of 34 slices shown (2 of 6)]
[im 1/34]
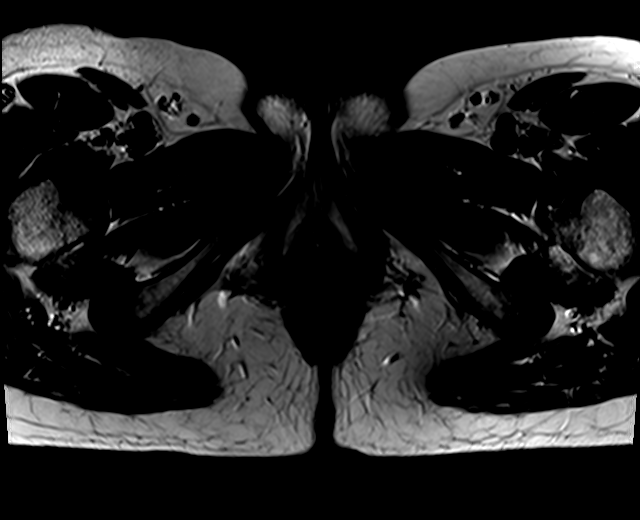

[Series 6: T2 · sagittal · 5.0mm · 0.78mm/px · 1 of 38 slices shown (3 of 6)]
[im 1/38]
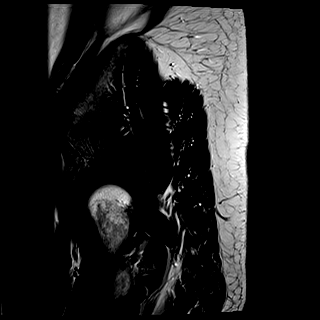

[Series 7: T2 fat-sat · axial · 5.0mm · 0.44mm/px · 1 of 34 slices shown]
[im 1/34]
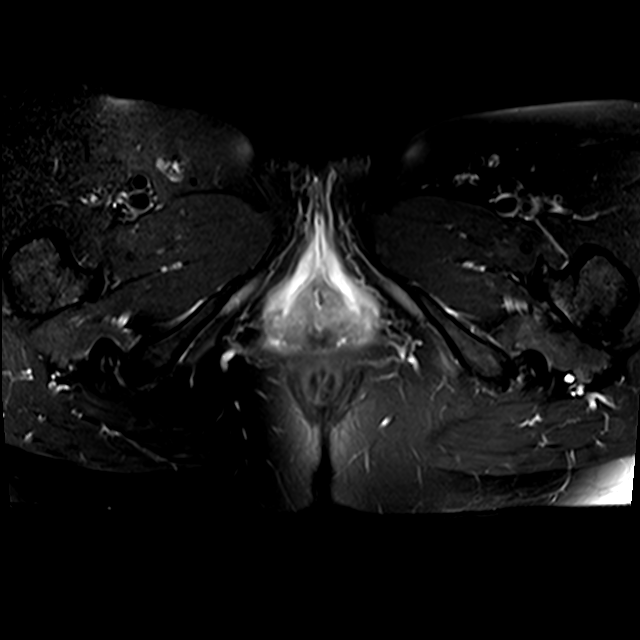

[Series 8: T1 · axial · 1.2mm · 0.75mm/px · z∈[-216,-6]mm · 2 of 176 slices shown (1 of 2)]
[im 1/176]
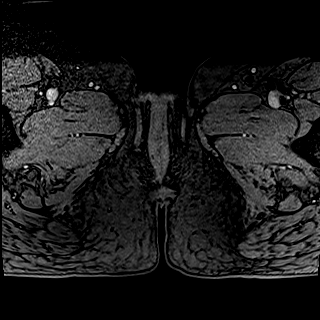
[im 176/176]
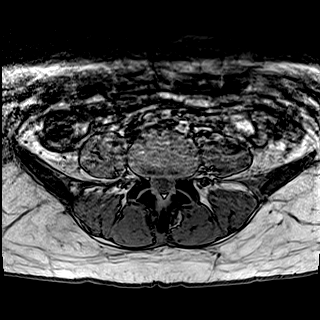

[Series 9: T1 fat-sat · axial · 1.2mm · 0.75mm/px · z∈[-216,-6]mm · 2 of 176 slices shown (1 of 2)]
[im 1/176]
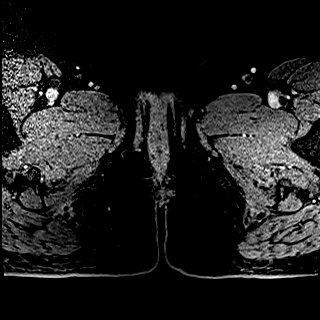
[im 176/176]
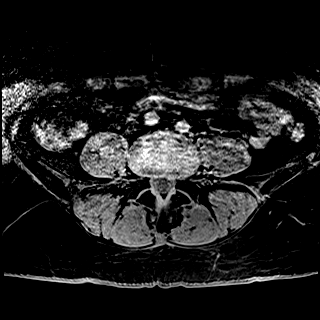

[Series 12: T2 · coronal · 5.0mm · 1.56mm/px · 1 of 36 slices shown (4 of 6)]
[im 1/36]
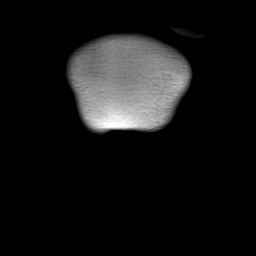

[Series 13: T1 · axial · 3.0mm · 1.19mm/px · z∈[-26,+211]mm · 2 of 160 slices shown (2 of 2)]
[im 1/160]
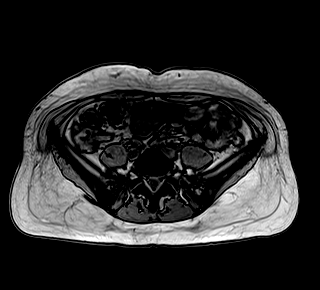
[im 160/160]
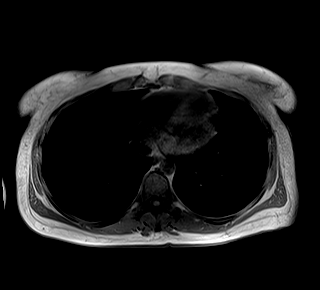

[Series 14: T2 · axial · 5.0mm · 1.48mm/px · 1 of 40 slices shown (5 of 6)]
[im 1/40]
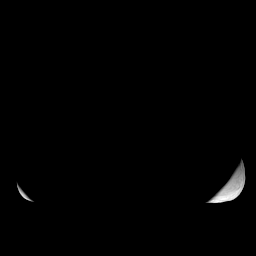

[Series 15: DWI · axial · 5.0mm · 1.42mm/px · 1 of 120 slices shown (1 of 2)]
[im 1/120]
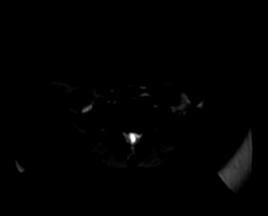

[Series 16: DWI · axial · 5.0mm · 1.42mm/px · 1 of 40 slices shown (2 of 2)]
[im 1/40]
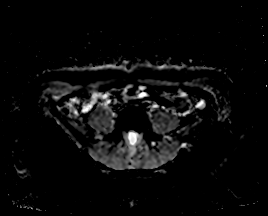

[Series 17: T2 · axial · 6.0mm · 1.22mm/px · 1 of 36 slices shown (6 of 6)]
[im 1/36]
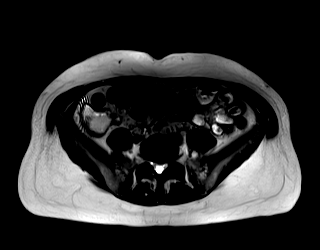

[Series 18: bSSFP · axial · 5.5mm · 1.25mm/px · 1 of 44 slices shown]
[im 1/44]
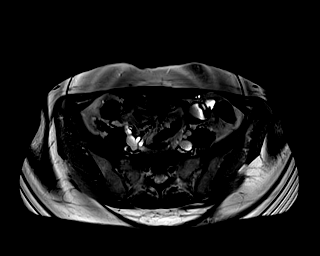

[Series 19: T1 dynamic · axial · non-contrast · 3.0mm · 1.19mm/px · 1 of 104 slices shown]
[im 1/104]
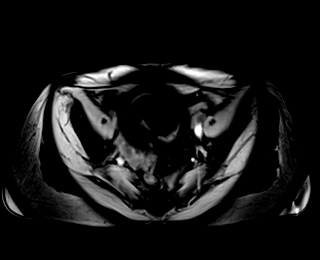

[Series 20: T1 dynamic post-contrast · axial · 3.0mm · 1.19mm/px · z∈[-90,+219]mm · 2 of 104 slices shown (1 of 12)]
[im 1/104]
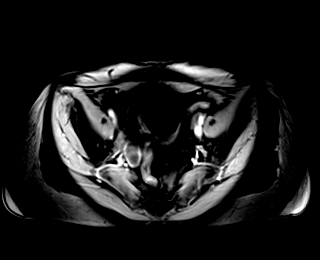
[im 104/104]
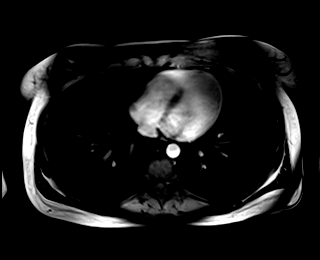

[Series 21: T1 dynamic post-contrast · axial · 3.0mm · 1.19mm/px · z∈[-90,+219]mm · 2 of 104 slices shown (2 of 12)]
[im 1/104]
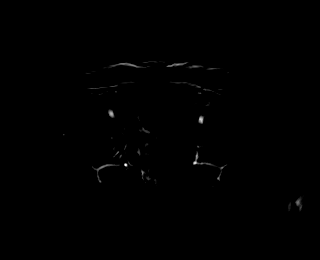
[im 104/104]
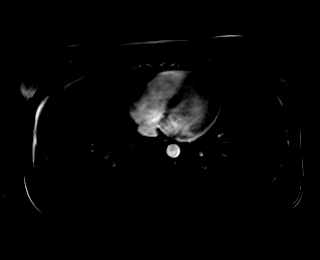

[Series 22: T1 dynamic post-contrast · axial · 3.0mm · 1.19mm/px · z∈[-90,+219]mm · 2 of 104 slices shown (3 of 12)]
[im 1/104]
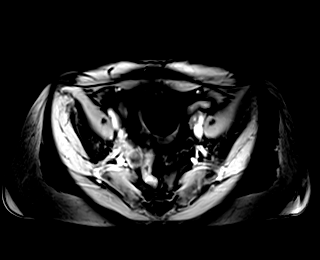
[im 104/104]
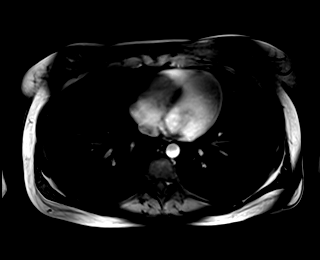

[Series 23: T1 dynamic post-contrast · axial · 3.0mm · 1.19mm/px · z∈[-90,+219]mm · 2 of 104 slices shown (4 of 12)]
[im 1/104]
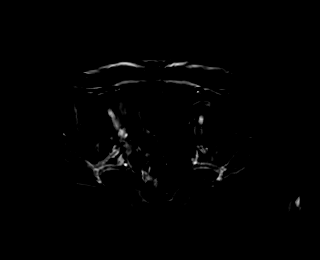
[im 104/104]
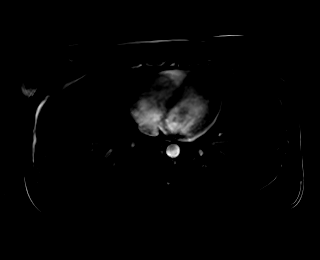

[Series 24: T1 dynamic post-contrast · axial · 3.0mm · 1.19mm/px · z∈[-90,+219]mm · 2 of 104 slices shown (5 of 12)]
[im 1/104]
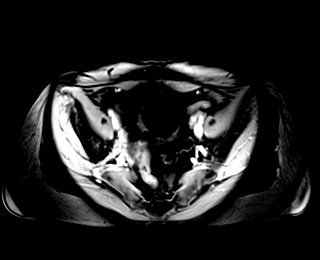
[im 104/104]
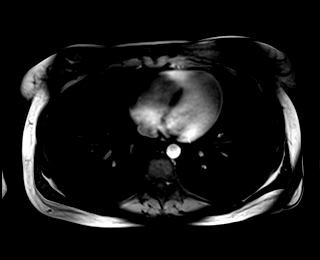

[Series 25: T1 dynamic post-contrast · axial · 3.0mm · 1.19mm/px · z∈[-90,+219]mm · 2 of 104 slices shown (6 of 12)]
[im 1/104]
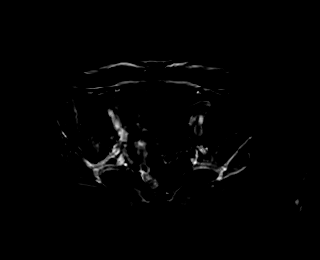
[im 104/104]
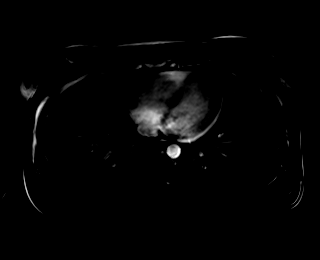

[Series 26: T1 dynamic post-contrast · coronal · 1.5mm · 1.38mm/px · 2 of 128 slices shown (7 of 12)]
[im 1/128]
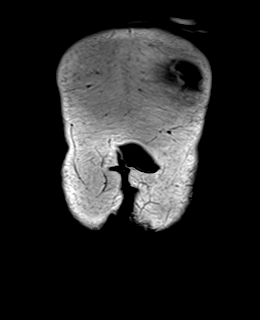
[im 128/128]
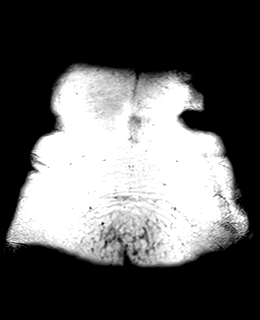

[Series 27: T1 dynamic post-contrast · coronal · 1.5mm · 1.38mm/px · 2 of 128 slices shown (8 of 12)]
[im 1/128]
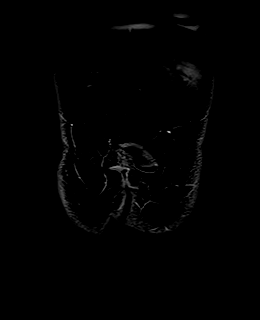
[im 128/128]
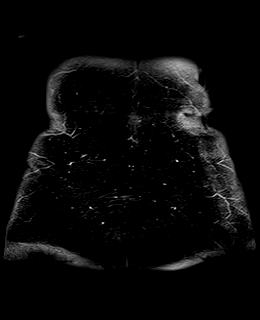

[Series 28: T1 dynamic post-contrast · axial · 3.0mm · 1.19mm/px · z∈[-90,+219]mm · 2 of 104 slices shown (9 of 12)]
[im 1/104]
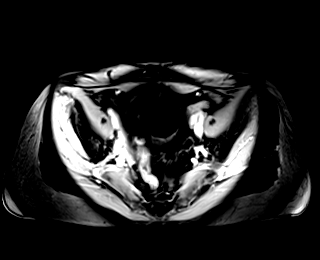
[im 104/104]
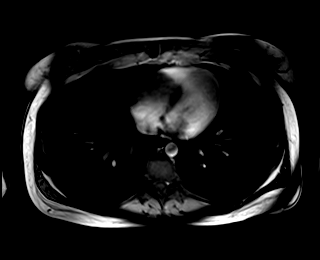

[Series 29: T1 dynamic post-contrast · axial · 3.0mm · 1.19mm/px · z∈[-90,+219]mm · 2 of 104 slices shown (10 of 12)]
[im 1/104]
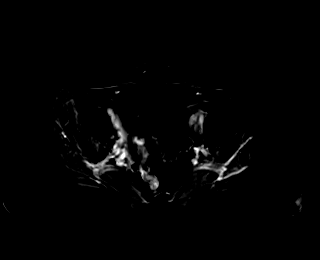
[im 104/104]
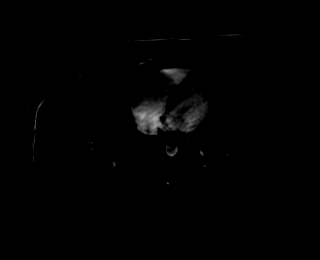

[Series 30: T1 dynamic post-contrast · axial · 3.0mm · 1.19mm/px · z∈[-90,+219]mm · 2 of 104 slices shown (11 of 12)]
[im 1/104]
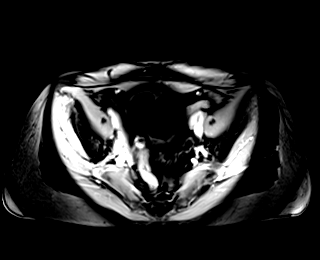
[im 104/104]
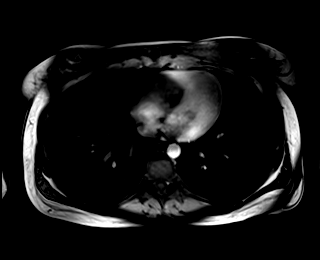

[Series 31: T1 dynamic post-contrast · axial · 3.0mm · 1.19mm/px · z∈[-90,+219]mm · 2 of 104 slices shown (12 of 12)]
[im 1/104]
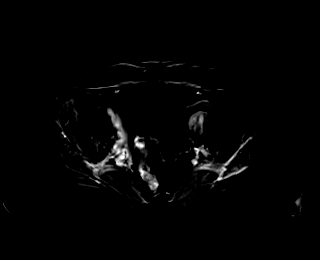
[im 104/104]
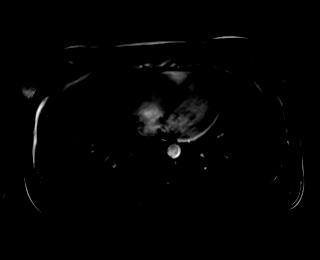

[Series 32: T1 fat-sat post-contrast · axial · 1.2mm · 0.75mm/px · z∈[-216,-6]mm · 3 of 176 slices shown]
[im 1/176]
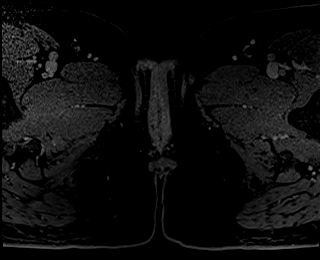
[im 88/176]
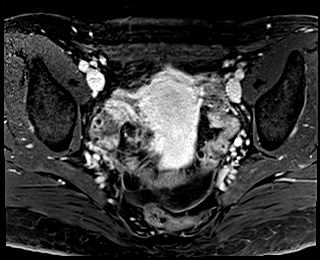
[im 176/176]
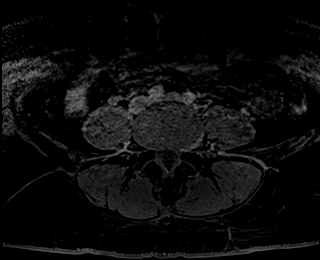

[Series 33: T1 fat-sat · coronal · 1.2mm · 0.81mm/px · 1 of 144 slices shown (2 of 2)]
[im 1/144]
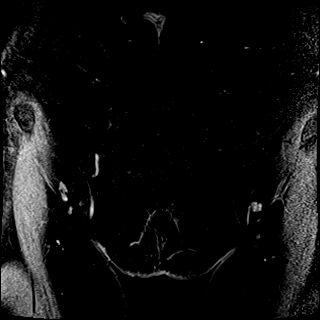

[45 of 48 positions shown; findings below may reference images not displayed]

FINDINGS: COMBINED FINDINGS FOR BOTH MR ABDOMEN AND PELVIS.

Evaluation of pelvis is limited by respiratory motion.

Lower chest: No acute abnormality.

Hepatobiliary: No hepatic steatosis. No suspicious hepatic lesion.
Gallbladder is distended without evidence of acute inflammation. No
biliary ductal dilation.

Pancreas: Intrinsic T1 signal a pancreatic parenchyma is within
normal limits. Homogeneous pancreatic parenchymal enhancement
postcontrast administration. No pancreatic ductal dilation. No
cystic or solid hyperenhancing pancreatic lesion identified

Spleen:  Within normal limits in size and appearance.

Adrenals/Urinary Tract: Bilateral adrenal glands appear normal. No
hydronephrosis. No solid enhancing renal mass.

Stomach/Bowel: Stomach is unremarkable. No pathologic dilation of
small or large bowel. No evidence of acute bowel inflammation. Prior
appendectomy, no abnormal enhancing soft tissue nodularity along the
cecum. Small volume of formed stool in the colon.

Vascular/Lymphatic: No abdominal aortic aneurysm. The portal,
splenic and superior mesenteric veins are patent. No pathologically
enlarged abdominal or pelvic lymph nodes.

Reproductive: Uterus measures 13 x 4.2 x 4.1 cm. Junctional zone
measures 2-3 mm. Endometrial stripe measures 6 mm. Bilateral ovaries
are within normal limits. Corpus luteal cyst in the right ovary. No
suspicious adnexal mass.

Other:  Trace pelvic free fluid within physiologic normal limits.

Musculoskeletal: No suspicious bone lesions identified.
IMPRESSION: No evidence of carcinoid tumor recurrence or metastatic disease in
the abdomen or pelvis on this study which is motion degraded through
the pelvis. If continued clinical concern for carcinoid tumor
recurrence/metastases consider further evaluation with PET-CT [4M]
Dotatate.

No acute abnormality in the abdomen or pelvis identified.

## 2021-04-05 MED ORDER — GADOBENATE DIMEGLUMINE 529 MG/ML IV SOLN
15.0000 mL | Freq: Once | INTRAVENOUS | Status: AC | PRN
  Administered 2021-04-05: 15 mL via INTRAVENOUS

## 2021-06-01 ENCOUNTER — Other Ambulatory Visit: Payer: Self-pay | Admitting: Physician Assistant

## 2021-06-01 DIAGNOSIS — F17200 Nicotine dependence, unspecified, uncomplicated: Secondary | ICD-10-CM

## 2021-06-01 DIAGNOSIS — R61 Generalized hyperhidrosis: Secondary | ICD-10-CM

## 2021-06-01 DIAGNOSIS — R634 Abnormal weight loss: Secondary | ICD-10-CM

## 2023-07-17 ENCOUNTER — Encounter (HOSPITAL_BASED_OUTPATIENT_CLINIC_OR_DEPARTMENT_OTHER): Payer: Self-pay | Admitting: Certified Nurse Midwife

## 2023-08-20 ENCOUNTER — Encounter (HOSPITAL_BASED_OUTPATIENT_CLINIC_OR_DEPARTMENT_OTHER): Payer: Self-pay | Admitting: Certified Nurse Midwife

## 2023-08-21 ENCOUNTER — Other Ambulatory Visit: Payer: Self-pay | Admitting: Nurse Practitioner

## 2023-08-21 DIAGNOSIS — Z859 Personal history of malignant neoplasm, unspecified: Secondary | ICD-10-CM

## 2023-10-12 ENCOUNTER — Other Ambulatory Visit

## 2024-01-16 ENCOUNTER — Encounter (INDEPENDENT_AMBULATORY_CARE_PROVIDER_SITE_OTHER): Payer: Self-pay
